# Patient Record
Sex: Female | Born: 1945 | Race: Black or African American | Hispanic: No | Marital: Single | Smoking: Never smoker
Health system: Southern US, Community
[De-identification: ages and names within clinical notes are randomized; demographics above are authoritative.]

## PROBLEM LIST (undated history)

## (undated) DIAGNOSIS — M1990 Unspecified osteoarthritis, unspecified site: Secondary | ICD-10-CM

## (undated) DIAGNOSIS — F0150 Vascular dementia without behavioral disturbance: Secondary | ICD-10-CM

## (undated) DIAGNOSIS — E785 Hyperlipidemia, unspecified: Secondary | ICD-10-CM

## (undated) DIAGNOSIS — R32 Unspecified urinary incontinence: Secondary | ICD-10-CM

## (undated) DIAGNOSIS — E119 Type 2 diabetes mellitus without complications: Secondary | ICD-10-CM

## (undated) DIAGNOSIS — F0390 Unspecified dementia without behavioral disturbance: Secondary | ICD-10-CM

## (undated) DIAGNOSIS — F329 Major depressive disorder, single episode, unspecified: Secondary | ICD-10-CM

## (undated) DIAGNOSIS — I639 Cerebral infarction, unspecified: Secondary | ICD-10-CM

## (undated) DIAGNOSIS — G40909 Epilepsy, unspecified, not intractable, without status epilepticus: Secondary | ICD-10-CM

## (undated) DIAGNOSIS — G8191 Hemiplegia, unspecified affecting right dominant side: Secondary | ICD-10-CM

## (undated) DIAGNOSIS — N189 Chronic kidney disease, unspecified: Secondary | ICD-10-CM

## (undated) DIAGNOSIS — C801 Malignant (primary) neoplasm, unspecified: Secondary | ICD-10-CM

## (undated) DIAGNOSIS — R569 Unspecified convulsions: Secondary | ICD-10-CM

## (undated) DIAGNOSIS — E538 Deficiency of other specified B group vitamins: Secondary | ICD-10-CM

## (undated) DIAGNOSIS — N289 Disorder of kidney and ureter, unspecified: Secondary | ICD-10-CM

## (undated) DIAGNOSIS — I10 Essential (primary) hypertension: Secondary | ICD-10-CM

## (undated) DIAGNOSIS — F32A Depression, unspecified: Secondary | ICD-10-CM

## (undated) DIAGNOSIS — M109 Gout, unspecified: Secondary | ICD-10-CM

## (undated) DIAGNOSIS — K219 Gastro-esophageal reflux disease without esophagitis: Secondary | ICD-10-CM

## (undated) HISTORY — DX: Deficiency of other specified B group vitamins: E53.8

## (undated) HISTORY — DX: Hemiplegia, unspecified affecting right dominant side: G81.91

## (undated) HISTORY — DX: Vascular dementia, unspecified severity, without behavioral disturbance, psychotic disturbance, mood disturbance, and anxiety: F01.50

## (undated) HISTORY — DX: Unspecified urinary incontinence: R32

## (undated) HISTORY — DX: Unspecified osteoarthritis, unspecified site: M19.90

---

## 2015-08-12 ENCOUNTER — Emergency Department (HOSPITAL_COMMUNITY)
Admission: EM | Admit: 2015-08-12 | Discharge: 2015-08-13 | Disposition: A | Discharge status: Home / Self Care | Payer: Medicare Other | Attending: Emergency Medicine | Admitting: Emergency Medicine

## 2015-08-12 ENCOUNTER — Encounter (HOSPITAL_COMMUNITY): Payer: Self-pay | Admitting: Emergency Medicine

## 2015-08-12 DIAGNOSIS — M1990 Unspecified osteoarthritis, unspecified site: Secondary | ICD-10-CM | POA: Insufficient documentation

## 2015-08-12 DIAGNOSIS — F0390 Unspecified dementia without behavioral disturbance: Secondary | ICD-10-CM | POA: Diagnosis not present

## 2015-08-12 DIAGNOSIS — Z8742 Personal history of other diseases of the female genital tract: Secondary | ICD-10-CM | POA: Diagnosis not present

## 2015-08-12 DIAGNOSIS — I10 Essential (primary) hypertension: Secondary | ICD-10-CM | POA: Insufficient documentation

## 2015-08-12 DIAGNOSIS — E785 Hyperlipidemia, unspecified: Secondary | ICD-10-CM | POA: Diagnosis not present

## 2015-08-12 DIAGNOSIS — Z7902 Long term (current) use of antithrombotics/antiplatelets: Secondary | ICD-10-CM | POA: Insufficient documentation

## 2015-08-12 DIAGNOSIS — Z880 Allergy status to penicillin: Secondary | ICD-10-CM | POA: Insufficient documentation

## 2015-08-12 DIAGNOSIS — M109 Gout, unspecified: Secondary | ICD-10-CM | POA: Diagnosis not present

## 2015-08-12 DIAGNOSIS — Z79899 Other long term (current) drug therapy: Secondary | ICD-10-CM | POA: Insufficient documentation

## 2015-08-12 DIAGNOSIS — R4182 Altered mental status, unspecified: Secondary | ICD-10-CM | POA: Diagnosis present

## 2015-08-12 DIAGNOSIS — G8190 Hemiplegia, unspecified affecting unspecified side: Secondary | ICD-10-CM

## 2015-08-12 DIAGNOSIS — I69351 Hemiplegia and hemiparesis following cerebral infarction affecting right dominant side: Secondary | ICD-10-CM | POA: Insufficient documentation

## 2015-08-12 DIAGNOSIS — K219 Gastro-esophageal reflux disease without esophagitis: Secondary | ICD-10-CM | POA: Insufficient documentation

## 2015-08-12 DIAGNOSIS — Z859 Personal history of malignant neoplasm, unspecified: Secondary | ICD-10-CM | POA: Diagnosis not present

## 2015-08-12 DIAGNOSIS — R4701 Aphasia: Secondary | ICD-10-CM

## 2015-08-12 DIAGNOSIS — F329 Major depressive disorder, single episode, unspecified: Secondary | ICD-10-CM | POA: Insufficient documentation

## 2015-08-12 DIAGNOSIS — G40909 Epilepsy, unspecified, not intractable, without status epilepticus: Secondary | ICD-10-CM | POA: Insufficient documentation

## 2015-08-12 HISTORY — DX: Unspecified osteoarthritis, unspecified site: M19.90

## 2015-08-12 HISTORY — DX: Unspecified dementia, unspecified severity, without behavioral disturbance, psychotic disturbance, mood disturbance, and anxiety: F03.90

## 2015-08-12 HISTORY — DX: Gout, unspecified: M10.9

## 2015-08-12 HISTORY — DX: Malignant (primary) neoplasm, unspecified: C80.1

## 2015-08-12 HISTORY — DX: Depression, unspecified: F32.A

## 2015-08-12 HISTORY — DX: Gastro-esophageal reflux disease without esophagitis: K21.9

## 2015-08-12 HISTORY — DX: Unspecified convulsions: R56.9

## 2015-08-12 HISTORY — DX: Hyperlipidemia, unspecified: E78.5

## 2015-08-12 HISTORY — DX: Major depressive disorder, single episode, unspecified: F32.9

## 2015-08-12 HISTORY — DX: Cerebral infarction, unspecified: I63.9

## 2015-08-12 HISTORY — DX: Disorder of kidney and ureter, unspecified: N28.9

## 2015-08-12 HISTORY — DX: Essential (primary) hypertension: I10

## 2015-08-12 LAB — PROTIME-INR
INR: 1.2 (ref 0.00–1.49)
Prothrombin Time: 15.4 seconds — ABNORMAL HIGH (ref 11.6–15.2)

## 2015-08-12 LAB — COMPREHENSIVE METABOLIC PANEL
ALT: 72 U/L — ABNORMAL HIGH (ref 14–54)
AST: 64 U/L — ABNORMAL HIGH (ref 15–41)
Albumin: 3.6 g/dL (ref 3.5–5.0)
Alkaline Phosphatase: 91 U/L (ref 38–126)
Anion gap: 10 (ref 5–15)
BUN: 45 mg/dL — ABNORMAL HIGH (ref 6–20)
CHLORIDE: 108 mmol/L (ref 101–111)
CO2: 23 mmol/L (ref 22–32)
Calcium: 9.7 mg/dL (ref 8.9–10.3)
Creatinine, Ser: 2.08 mg/dL — ABNORMAL HIGH (ref 0.44–1.00)
GFR, EST AFRICAN AMERICAN: 27 mL/min — AB (ref >60)
GFR, EST NON AFRICAN AMERICAN: 23 mL/min — AB (ref >60)
Glucose, Bld: 147 mg/dL — ABNORMAL HIGH (ref 65–99)
POTASSIUM: 5.1 mmol/L (ref 3.5–5.1)
Sodium: 141 mmol/L (ref 135–145)
Total Bilirubin: 0.7 mg/dL (ref 0.3–1.2)
Total Protein: 8.4 g/dL — ABNORMAL HIGH (ref 6.5–8.1)

## 2015-08-12 LAB — CBC
HEMATOCRIT: 43 % (ref 36.0–46.0)
Hemoglobin: 13.7 g/dL (ref 12.0–15.0)
MCH: 30.9 pg (ref 26.0–34.0)
MCHC: 31.9 g/dL (ref 30.0–36.0)
MCV: 96.8 fL (ref 78.0–100.0)
Platelets: 145 10*3/uL — ABNORMAL LOW (ref 150–400)
RBC: 4.44 MIL/uL (ref 3.87–5.11)
RDW: 13.3 % (ref 11.5–15.5)
WBC: 9.4 10*3/uL (ref 4.0–10.5)

## 2015-08-12 NOTE — ED Notes (Signed)
Pt here via EMS from [place]. 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 Pt daughter sts deficits are getting progressively worse. Staff at facility noticed acute onset intermittent tremors in all extremities. EMS only noted tremor to LUE once. Pt has been non verbal since the CVA, Pt looking around in triage.

## 2015-08-12 NOTE — ED Notes (Signed)
Pt non verbal with right sided deficits from previous stroke. Seen at different hospital then. Pt came from [facility] today for possible altered mental status. Pt responds to painful stimuli and can nod yes and no when she chooses. Pt does not follow commands and does not answer all questions. Pt able to feel sensation to all extremities. Pt was able to nod  "yes" when asked if she wanted the light off and also nodded "no" when asked if she wanted the tv on.

## 2015-08-12 NOTE — ED Notes (Signed)
Pt here from [place].

## 2015-08-13 ENCOUNTER — Emergency Department (HOSPITAL_COMMUNITY): Payer: Medicare Other

## 2015-08-13 ENCOUNTER — Encounter (HOSPITAL_COMMUNITY): Payer: Self-pay

## 2015-08-13 DIAGNOSIS — I69351 Hemiplegia and hemiparesis following cerebral infarction affecting right dominant side: Secondary | ICD-10-CM | POA: Diagnosis not present

## 2015-08-13 LAB — URINALYSIS, ROUTINE W REFLEX MICROSCOPIC
GLUCOSE, UA: NEGATIVE mg/dL
Hgb urine dipstick: NEGATIVE
KETONES UR: 15 mg/dL — AB
NITRITE: NEGATIVE
PH: 5 (ref 5.0–8.0)
Protein, ur: NEGATIVE mg/dL
SPECIFIC GRAVITY, URINE: 1.021 (ref 1.005–1.030)
Urobilinogen, UA: 1 mg/dL (ref 0.0–1.0)

## 2015-08-13 LAB — URINE MICROSCOPIC-ADD ON

## 2015-08-13 MED ORDER — LEVOFLOXACIN 750 MG PO TABS: 750.0000 mg | ORAL_TABLET | Freq: Every day | ORAL | Status: DC

## 2015-08-13 MED ORDER — LEVOFLOXACIN 750 MG PO TABS
750.0000 mg | ORAL_TABLET | Freq: Once | ORAL | Status: AC
  Administered 2015-08-13: 750 mg via ORAL
  Filled 2015-08-13: qty 1

## 2015-08-13 MED ORDER — SODIUM CHLORIDE 0.9 % IV BOLUS (SEPSIS)
1000.0000 mL | Freq: Once | INTRAVENOUS | Status: AC
  Administered 2015-08-13: 1000 mL via INTRAVENOUS

## 2015-08-13 NOTE — ED Provider Notes (Signed)
CSN: [id number]     Arrival date & time 08/12/15  2020 History   First MD Initiated Contact with Patient 08/12/15 2049     Chief Complaint  Patient presents with  . 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  The staff and family were concerned because she seemed to have more progression of deficits on the right.  The staff states they noticed a tremor in all of her extremities.  The patient is unable to give me any history due to the fact that she is aphasic.   Past Medical History  Diagnosis Date  . Arthritis   . Stroke   . Renal disorder   . Seizures   . GERD (gastroesophageal reflux disease)   . Depression   . Gout   . Hypertension   . Hyperlipidemia   . Cancer   . Dementia    History reviewed. No pertinent past surgical history. History reviewed. No pertinent family history. Social History  Substance Use Topics  . Smoking status: Unknown If Ever Smoked  . Smokeless tobacco: None  . Alcohol Use: None   OB History    No data available     Review of Systems  Level V caveat applies due to aphasia  Allergies  Contrast media; Iodine; Keflex; Macrodantin; and Penicillins  Home Medications   Prior to Admission medications   Medication Sig Start Date End Date Taking? Authorizing Provider  atorvastatin (LIPITOR) 80 MG tablet Take 80 mg by mouth daily at 6 PM.   Yes Historical Provider, MD  Cholecalciferol 1000 UNITS capsule Take 2,000 Units by mouth daily.   Yes Historical Provider, MD  clopidogrel (PLAVIX) 75 MG tablet Take 75 mg by mouth daily.   Yes Historical Provider, MD  colchicine 0.6 MG tablet Take 0.6 mg by mouth 2 (two) times daily.   Yes Historical Provider, MD  divalproex (DEPAKOTE ER) 500 MG 24 hr tablet Take 500 mg by  mouth 2 (two) times daily.   Yes Historical Provider, MD  donepezil (ARICEPT) 10 MG tablet Take 10 mg by mouth at bedtime.   Yes Historical Provider, MD  enalapril (VASOTEC) 20 MG tablet Take 20 mg by mouth daily.   Yes Historical Provider, MD  FLUoxetine (PROZAC) 10 MG tablet Take 10 mg by mouth daily.   Yes Historical Provider, MD  furosemide (LASIX) 20 MG tablet Take 20 mg by mouth daily.   Yes Historical Provider, MD  omega-3 acid ethyl esters (LOVAZA) 1 G capsule Take 1 g by mouth 2 (two) times daily.   Yes Historical Provider, MD  omeprazole (PRILOSEC) 20 MG capsule Take 20 mg by mouth daily.   Yes Historical Provider, MD  promethazine (PHENERGAN) 12.5 MG tablet Take 12.5 mg by mouth every 8 (eight) hours as needed for nausea or vomiting.   Yes Historical Provider, MD  Vitamin D, Ergocalciferol, (DRISDOL) 50000 UNITS CAPS capsule Take 50,000 Units by mouth every 7 (seven) days.   Yes Historical Provider, MD   BP 113/76 mmHg  Pulse 61  Temp(Src) 98.2 F (36.8 C)  Resp 16  SpO2 98% Physical Exam  Constitutional: She appears well-developed and well-nourished. No distress.  HENT:  Head: Normocephalic and atraumatic.  Mouth/Throat: Oropharynx is clear and moist.  Eyes: Pupils are equal, round, and reactive to light.  Neck: Normal range  of motion. Neck supple.  Cardiovascular: Normal rate, regular rhythm and normal heart sounds.  Exam reveals no gallop and no friction rub.   No murmur heard. Pulmonary/Chest: Effort normal and breath sounds normal. No respiratory distress.  Neurological: She is alert.  Patient has had a previous stroke in the last couple weeks and has a right hemiplegia but does follow commands and squeeze my fingers on the left and will press against my hand with the the left foot.  There is no significant tremor.  Patient is also a phasic  Skin: Skin is warm and dry. No rash noted. No erythema.  Psychiatric: She has a normal mood and affect. Her behavior is normal.   Nursing note and vitals reviewed.   ED Course  Procedures (including critical care time) Labs Review Labs Reviewed  COMPREHENSIVE METABOLIC PANEL - Abnormal; Notable for the following:    Glucose, Bld 147 (*)    BUN 45 (*)    Creatinine, Ser 2.08 (*)    Total Protein 8.4 (*)    AST 64 (*)    ALT 72 (*)    GFR calc non Af Amer 23 (*)    GFR calc Af Amer 27 (*)    All other components within normal limits  CBC - Abnormal; Notable for the following:    Platelets 145 (*)    All other components within normal limits  PROTIME-INR - Abnormal; Notable for the following:    Prothrombin Time 15.4 (*)    All other components within normal limits  CBG MONITORING, ED    Imaging Review Dg Chest 2 View  08/13/2015   CLINICAL DATA:  69 year old female with lower and recent stroke  EXAM: CHEST  2 VIEW  COMPARISON:  None.  FINDINGS: The heart size and mediastinal contours are within normal limits. Both lungs are clear. The visualized skeletal structures are unremarkable.  IMPRESSION: No active cardiopulmonary disease.   Electronically Signed   By: Anner Crete M.D.   On: 08/13/2015 01:03   I have personally reviewed and evaluated these images and lab results as part of my medical decision-making.   The patient will follow my commands and does not appear to be tremoring on my examination, the patient appears to have standard hemiplegia and aphasia   Dalia Heading, PA-C 08/13/15 0125  Everlene Balls, MD 08/13/15 [phone]

## 2015-08-13 NOTE — Discharge Instructions (Signed)
Return here as needed.  Follow-up with your primary care doctorUrinary Tract Infection A urinary tract infection (UTI) can occur any place along the urinary tract. The tract includes the kidneys, ureters, bladder, and urethra. A type of germ called bacteria often causes a UTI. UTIs are often helped with antibiotic medicine.  HOME CARE   If given, take antibiotics as told by your doctor. Finish them even if you start to feel better.  Drink enough fluids to keep your pee (urine) clear or pale yellow.  Avoid tea, drinks with caffeine, and bubbly (carbonated) drinks.  Pee often. Avoid holding your pee in for a long time.  Pee before and after having sex (intercourse).  Wipe from front to back after you poop (bowel movement) if you are a woman. Use each tissue only once. GET HELP RIGHT AWAY IF:   You have back pain.  You have lower belly (abdominal) pain.  You have chills.  You feel sick to your stomach (nauseous).  You throw up (vomit).  Your burning or discomfort with peeing does not go away.  You have a fever.  Your symptoms are not better in 3 days. MAKE SURE YOU:   Understand these instructions.  Will watch your condition.  Will get help right away if you are not doing well or get worse. Document Released: 05/04/2008 Document Revised: 08/10/2012 Document Reviewed: 06/16/2012 [facility] Patient Information 2015 [place], [place]. This information is not intended to replace advice given to you by your health care provider. Make sure you discuss any questions you have with your health care provider.

## 2016-08-15 IMAGING — CR DG CHEST 2V
2 series · 2 of 2 positions shown · non-contrast
Comparison: None.

CLINICAL DATA: 69-year-old female with lower and recent stroke

EXAM:
CHEST  2 VIEW

[chest lat]
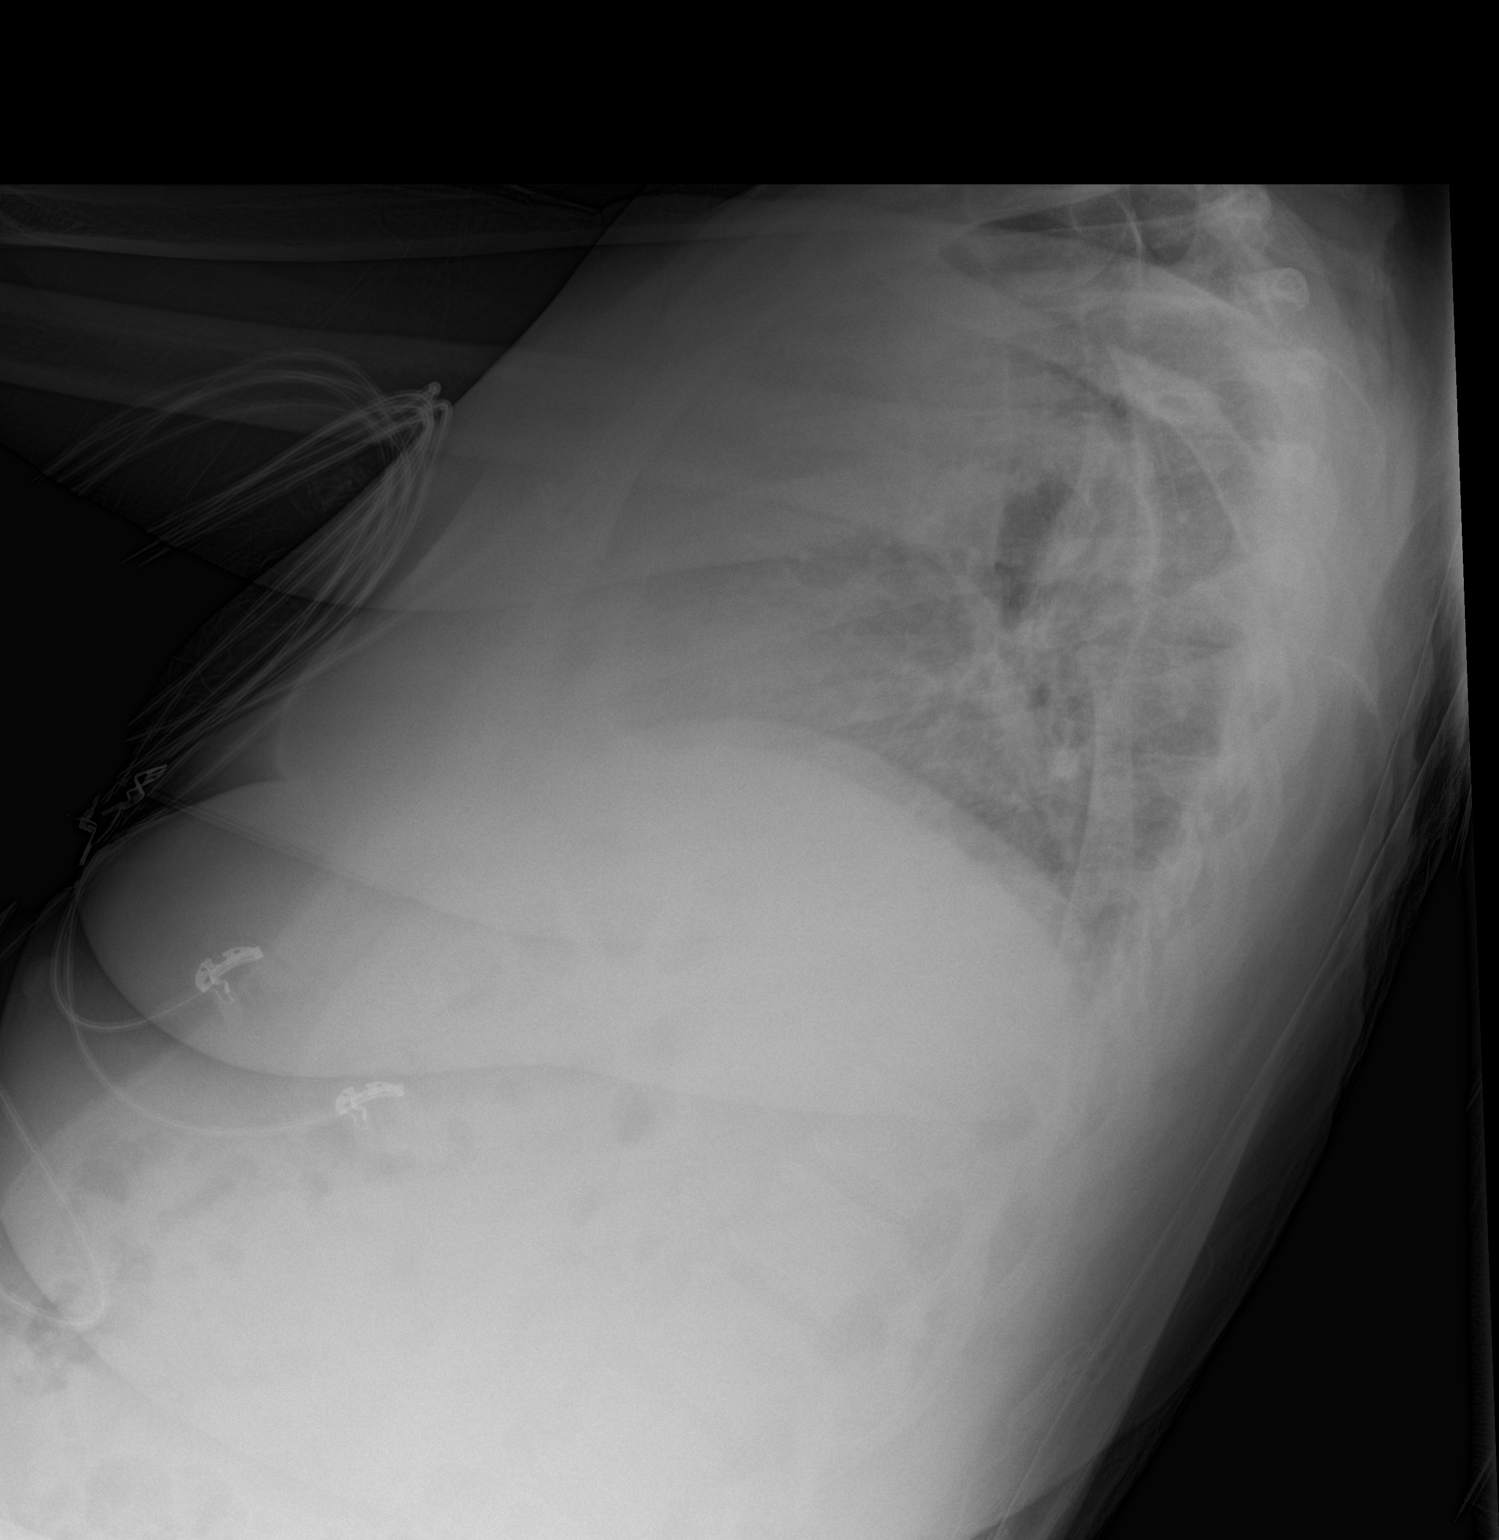

[chest ap]
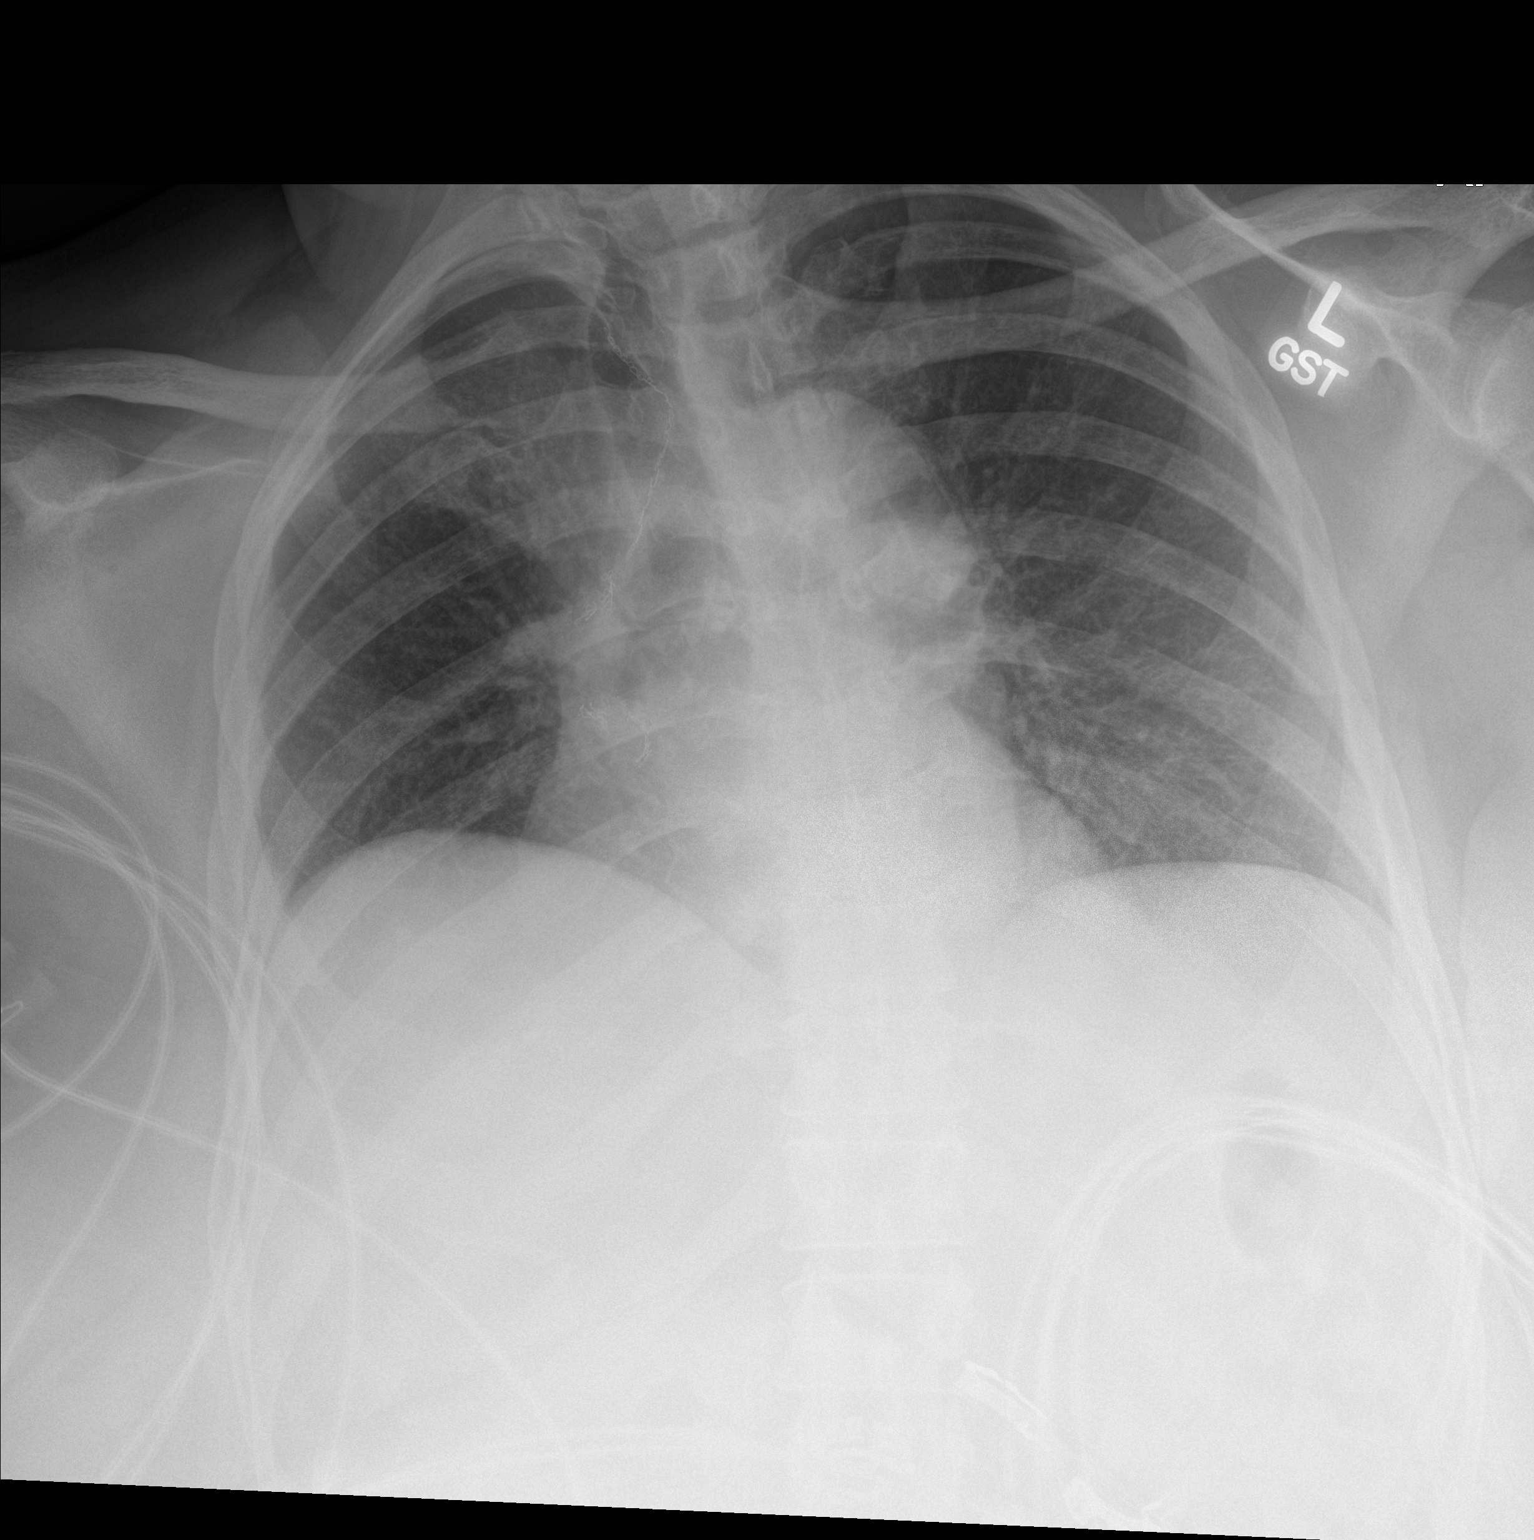

[2 of 2 positions shown; findings below may reference images not displayed]

FINDINGS: The heart size and mediastinal contours are within normal limits.
Both lungs are clear. The visualized skeletal structures are
unremarkable.
IMPRESSION: No active cardiopulmonary disease.

## 2016-08-15 IMAGING — CT CT HEAD W/O CM
2 series · 15 of 30 positions shown, 19 images · non-contrast
Comparison: None.  No prior exams available for comparison.

CLINICAL DATA: Tremor. Recent CVA 2 weeks prior with right-sided
deficit. Progressive deficits.

EXAM:
CT HEAD WITHOUT CONTRAST
TECHNIQUE: Contiguous axial images were obtained from the base of the skull
through the vertex without intravenous contrast.

[Series 2: head 5.0 h30s · axial · 0.43mm/px · z∈[-151,-26]mm · 13 of 31 slices shown, 17 images]
[im 3/31  brain]
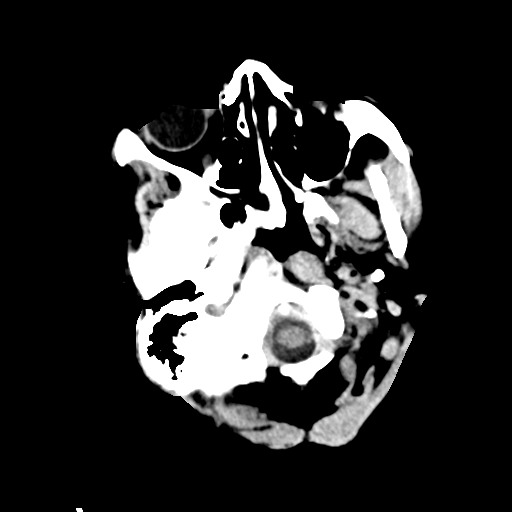
[im 3/31  bone]
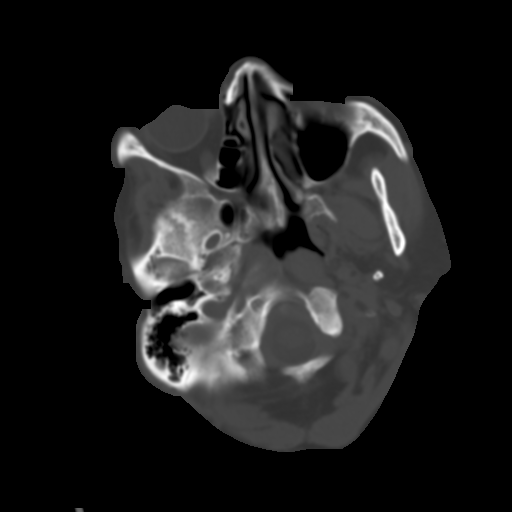
[im 5/31  brain]
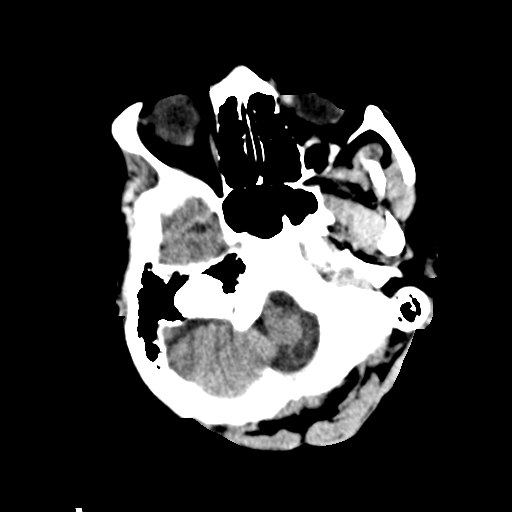
[im 7/31  brain]
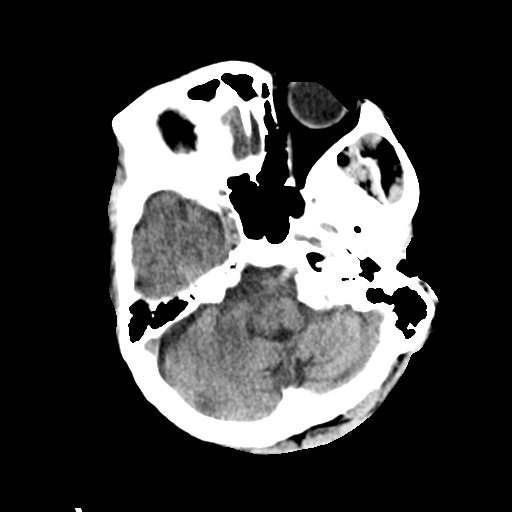
[im 9/31  brain]
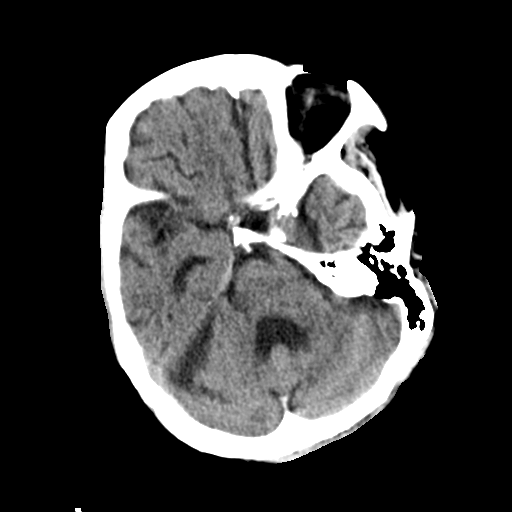
[im 11/31  brain]
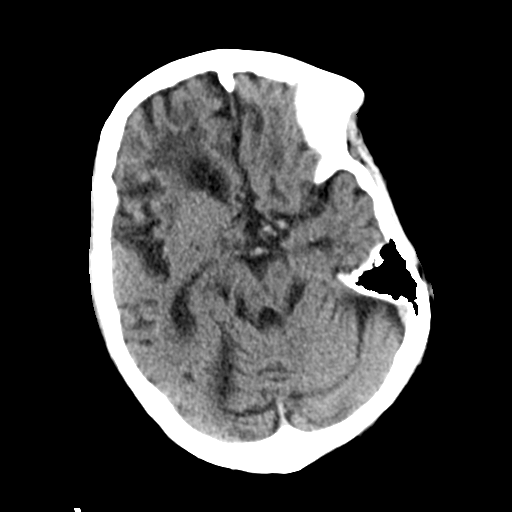
[im 11/31  bone]
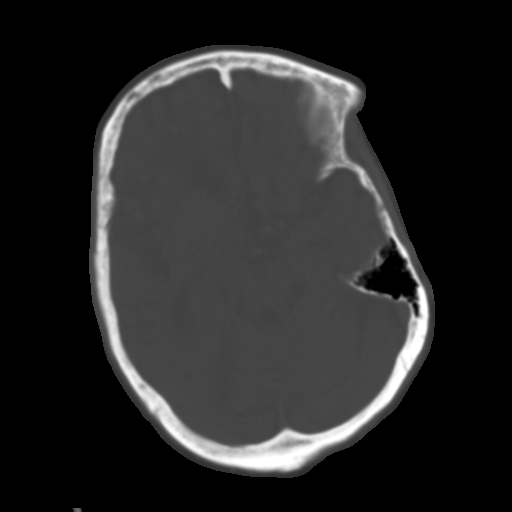
[im 13/31  brain]
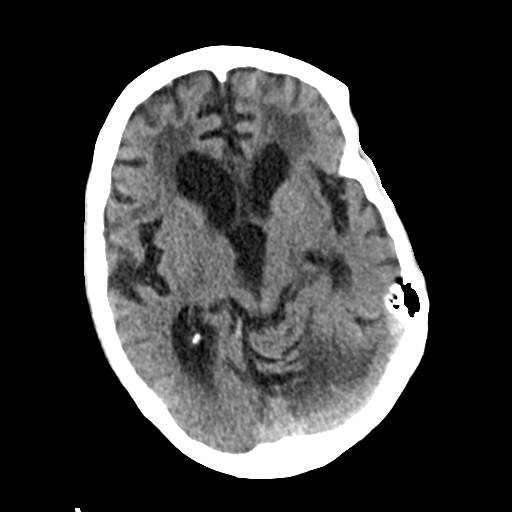
[im 16/31  brain]
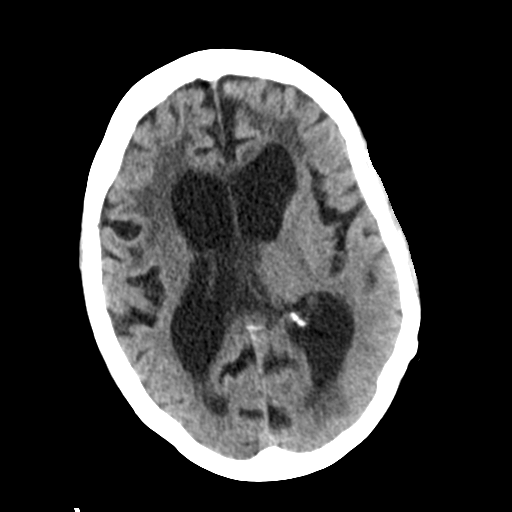
[im 18/31  brain]
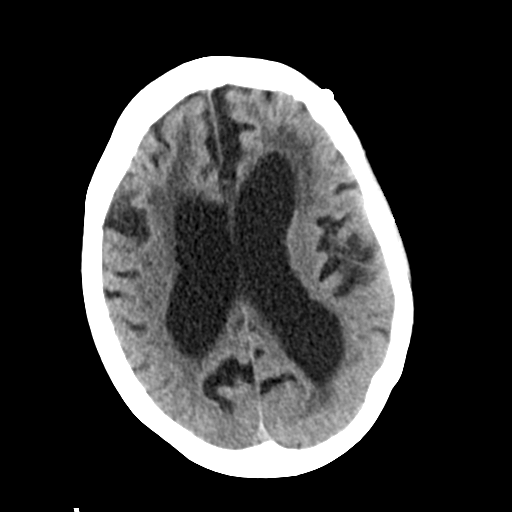
[im 20/31  brain]
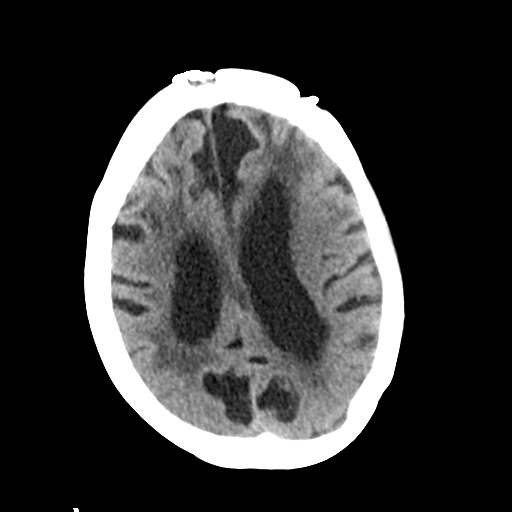
[im 20/31  bone]
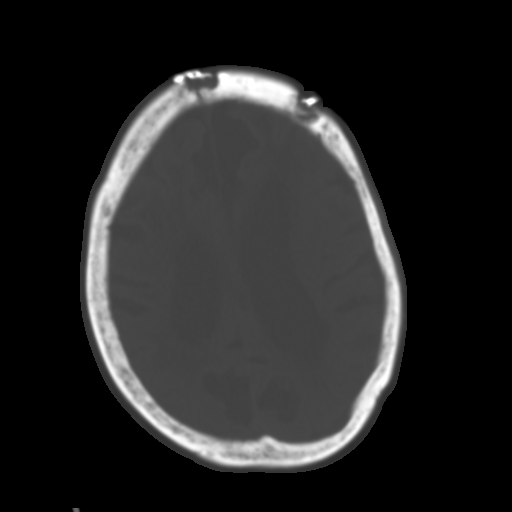
[im 22/31  brain]
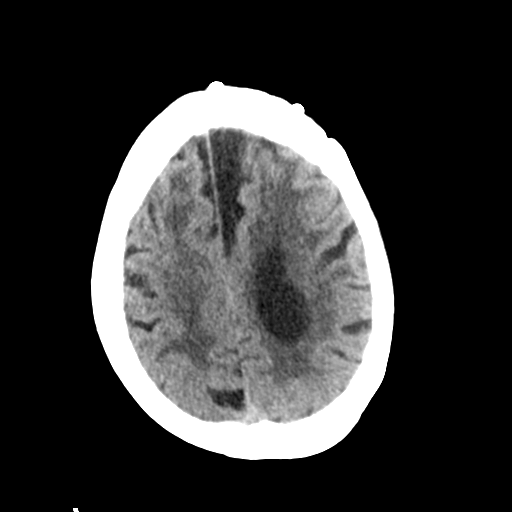
[im 24/31  brain]
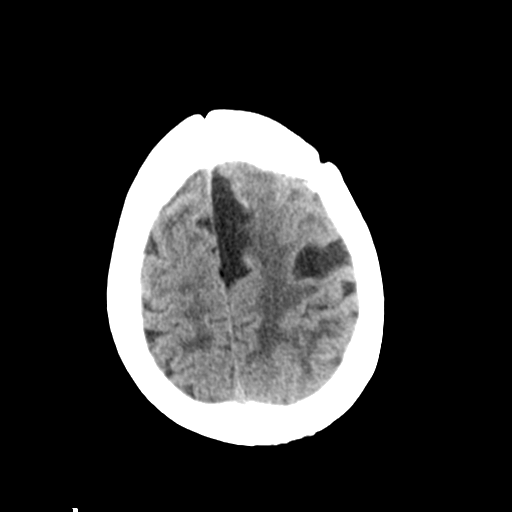
[im 26/31  brain]
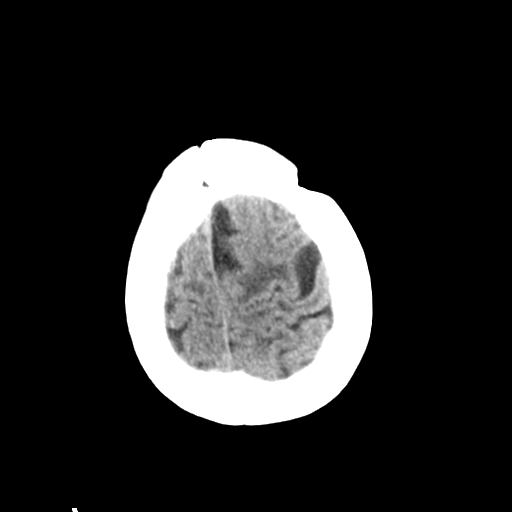
[im 28/31  brain]
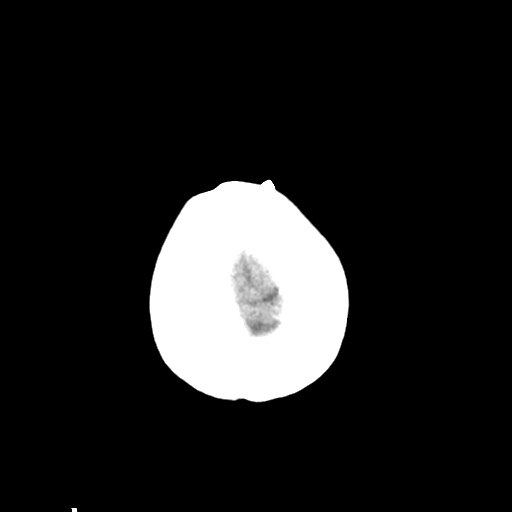
[im 28/31  bone]
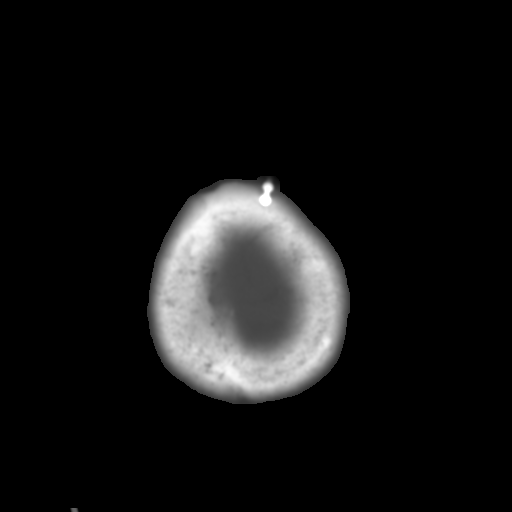

[Series 602: <mpr range> · axial · 0.44mm/px · z∈[-176,-156]mm · 2 of 30 slices shown]
[im 3/30  brain]
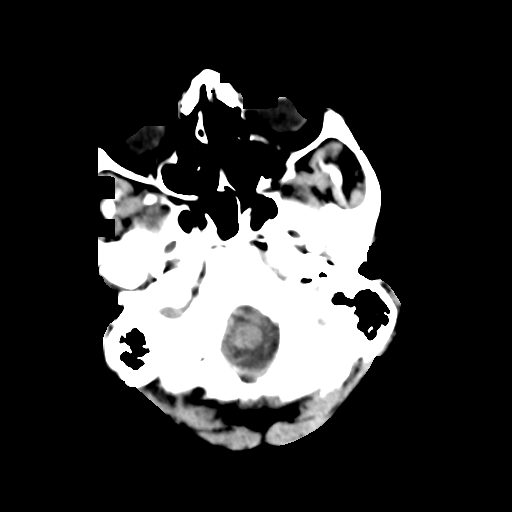
[im 7/30  brain]
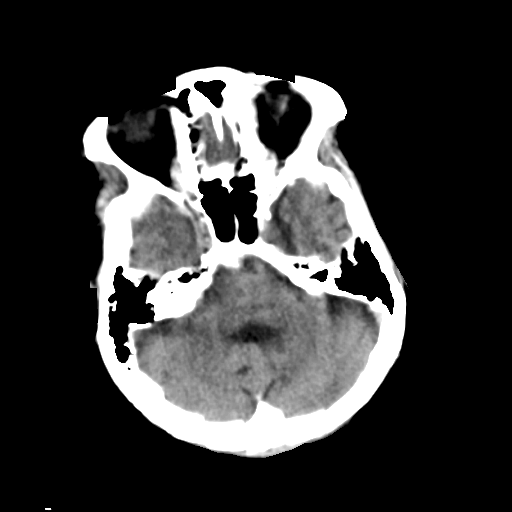

[15 of 30 positions shown; findings below may reference images not displayed]

FINDINGS: There is ventriculomegaly, likely related atrophy. Prominence of the
extra-axial CSF spaces. There is periventricular and deep white
matter hypodensity, most consistent chronic small vessel ischemia.
Probable bifrontal encephalomalacia. No acute hemorrhage. No mass
effect or midline shift. No subdural fluid collection. Post
craniotomy change in the midline and left frontal bone. Diffusely
heterogeneous appearance of the calvarium, nonspecific. Included
paranasal sinuses and mastoid air cells are well aerated.
IMPRESSION: 1. No acute intracranial abnormality.
2. Advanced atrophy and chronic small vessel ischemia. Bifrontal
encephalomalacia.
3. Post craniotomy. Diffusely heterogeneous appearance of the
calvarium is nonspecific, can be seen in the setting of advanced
osteoporosis, sequela of anemia or chronic disease, or other
metabolic abnormalities. Metastatic disease not entirely excluded
but felt less likely given the diffuse nature. Recommend correlation
with any prior imaging.

## 2018-12-06 ENCOUNTER — Emergency Department (HOSPITAL_COMMUNITY): Payer: Medicare Other

## 2018-12-06 ENCOUNTER — Encounter (HOSPITAL_COMMUNITY): Payer: Self-pay | Admitting: Emergency Medicine

## 2018-12-06 ENCOUNTER — Observation Stay (HOSPITAL_COMMUNITY)
Admission: EM | Admit: 2018-12-06 | Discharge: 2018-12-09 | Disposition: A | Discharge status: Skilled Nursing Facility | Payer: Medicare Other | Attending: Internal Medicine | Admitting: Internal Medicine

## 2018-12-06 DIAGNOSIS — I69391 Dysphagia following cerebral infarction: Secondary | ICD-10-CM | POA: Diagnosis not present

## 2018-12-06 DIAGNOSIS — K117 Disturbances of salivary secretion: Secondary | ICD-10-CM

## 2018-12-06 DIAGNOSIS — F329 Major depressive disorder, single episode, unspecified: Secondary | ICD-10-CM | POA: Insufficient documentation

## 2018-12-06 DIAGNOSIS — G9341 Metabolic encephalopathy: Principal | ICD-10-CM | POA: Insufficient documentation

## 2018-12-06 DIAGNOSIS — R059 Cough, unspecified: Secondary | ICD-10-CM

## 2018-12-06 DIAGNOSIS — R05 Cough: Secondary | ICD-10-CM | POA: Diagnosis present

## 2018-12-06 DIAGNOSIS — Z993 Dependence on wheelchair: Secondary | ICD-10-CM | POA: Diagnosis not present

## 2018-12-06 DIAGNOSIS — R159 Full incontinence of feces: Secondary | ICD-10-CM | POA: Insufficient documentation

## 2018-12-06 DIAGNOSIS — R4701 Aphasia: Secondary | ICD-10-CM

## 2018-12-06 DIAGNOSIS — Z79899 Other long term (current) drug therapy: Secondary | ICD-10-CM | POA: Insufficient documentation

## 2018-12-06 DIAGNOSIS — I10 Essential (primary) hypertension: Secondary | ICD-10-CM | POA: Diagnosis present

## 2018-12-06 DIAGNOSIS — R509 Fever, unspecified: Secondary | ICD-10-CM | POA: Diagnosis present

## 2018-12-06 DIAGNOSIS — F0150 Vascular dementia without behavioral disturbance: Secondary | ICD-10-CM | POA: Diagnosis not present

## 2018-12-06 DIAGNOSIS — E1122 Type 2 diabetes mellitus with diabetic chronic kidney disease: Secondary | ICD-10-CM | POA: Diagnosis not present

## 2018-12-06 DIAGNOSIS — I129 Hypertensive chronic kidney disease with stage 1 through stage 4 chronic kidney disease, or unspecified chronic kidney disease: Secondary | ICD-10-CM | POA: Insufficient documentation

## 2018-12-06 DIAGNOSIS — D72829 Elevated white blood cell count, unspecified: Secondary | ICD-10-CM | POA: Diagnosis not present

## 2018-12-06 DIAGNOSIS — F32A Depression, unspecified: Secondary | ICD-10-CM | POA: Diagnosis present

## 2018-12-06 DIAGNOSIS — R5381 Other malaise: Secondary | ICD-10-CM | POA: Diagnosis not present

## 2018-12-06 DIAGNOSIS — R4182 Altered mental status, unspecified: Secondary | ICD-10-CM | POA: Diagnosis present

## 2018-12-06 DIAGNOSIS — N189 Chronic kidney disease, unspecified: Secondary | ICD-10-CM | POA: Diagnosis not present

## 2018-12-06 DIAGNOSIS — I69351 Hemiplegia and hemiparesis following cerebral infarction affecting right dominant side: Secondary | ICD-10-CM | POA: Diagnosis not present

## 2018-12-06 DIAGNOSIS — G8191 Hemiplegia, unspecified affecting right dominant side: Secondary | ICD-10-CM

## 2018-12-06 DIAGNOSIS — E785 Hyperlipidemia, unspecified: Secondary | ICD-10-CM | POA: Diagnosis not present

## 2018-12-06 DIAGNOSIS — G9340 Encephalopathy, unspecified: Secondary | ICD-10-CM | POA: Diagnosis present

## 2018-12-06 DIAGNOSIS — C3411 Malignant neoplasm of upper lobe, right bronchus or lung: Secondary | ICD-10-CM

## 2018-12-06 HISTORY — DX: Type 2 diabetes mellitus without complications: E11.9

## 2018-12-06 HISTORY — DX: Chronic kidney disease, unspecified: N18.9

## 2018-12-06 HISTORY — DX: Unspecified urinary incontinence: R32

## 2018-12-06 HISTORY — DX: Epilepsy, unspecified, not intractable, without status epilepticus: G40.909

## 2018-12-06 LAB — CBC WITH DIFFERENTIAL/PLATELET
Abs Immature Granulocytes: 0.08 10*3/uL — ABNORMAL HIGH (ref 0.00–0.07)
Basophils Absolute: 0 10*3/uL (ref 0.0–0.1)
Basophils Relative: 0 %
EOS ABS: 0.1 10*3/uL (ref 0.0–0.5)
Eosinophils Relative: 1 %
HCT: 46.4 % — ABNORMAL HIGH (ref 36.0–46.0)
Hemoglobin: 14.6 g/dL (ref 12.0–15.0)
Immature Granulocytes: 1 %
Lymphocytes Relative: 15 %
Lymphs Abs: 2 10*3/uL (ref 0.7–4.0)
MCH: 31.1 pg (ref 26.0–34.0)
MCHC: 31.5 g/dL (ref 30.0–36.0)
MCV: 98.7 fL (ref 80.0–100.0)
Monocytes Absolute: 0.8 10*3/uL (ref 0.1–1.0)
Monocytes Relative: 6 %
Neutro Abs: 9.9 10*3/uL — ABNORMAL HIGH (ref 1.7–7.7)
Neutrophils Relative %: 77 %
Platelets: 190 10*3/uL (ref 150–400)
RBC: 4.7 MIL/uL (ref 3.87–5.11)
RDW: 14.2 % (ref 11.5–15.5)
WBC: 12.8 10*3/uL — AB (ref 4.0–10.5)
nRBC: 0 % (ref 0.0–0.2)

## 2018-12-06 LAB — COMPREHENSIVE METABOLIC PANEL
ALT: 29 U/L (ref 0–44)
ANION GAP: 11 (ref 5–15)
AST: 27 U/L (ref 15–41)
Albumin: 4 g/dL (ref 3.5–5.0)
Alkaline Phosphatase: 78 U/L (ref 38–126)
BUN: 15 mg/dL (ref 8–23)
CO2: 24 mmol/L (ref 22–32)
Calcium: 10.1 mg/dL (ref 8.9–10.3)
Chloride: 103 mmol/L (ref 98–111)
Creatinine, Ser: 1.35 mg/dL — ABNORMAL HIGH (ref 0.44–1.00)
GFR calc Af Amer: 46 mL/min — ABNORMAL LOW (ref >60)
GFR calc non Af Amer: 39 mL/min — ABNORMAL LOW (ref >60)
Glucose, Bld: 153 mg/dL — ABNORMAL HIGH (ref 70–99)
POTASSIUM: 4.5 mmol/L (ref 3.5–5.1)
Sodium: 138 mmol/L (ref 135–145)
Total Bilirubin: 0.9 mg/dL (ref 0.3–1.2)
Total Protein: 9 g/dL — ABNORMAL HIGH (ref 6.5–8.1)

## 2018-12-06 LAB — URINALYSIS, ROUTINE W REFLEX MICROSCOPIC
BILIRUBIN URINE: NEGATIVE
Glucose, UA: NEGATIVE mg/dL
HGB URINE DIPSTICK: NEGATIVE
Ketones, ur: NEGATIVE mg/dL
Leukocytes, UA: NEGATIVE
Nitrite: NEGATIVE
Protein, ur: NEGATIVE mg/dL
Specific Gravity, Urine: 1.018 (ref 1.005–1.030)
pH: 5 (ref 5.0–8.0)

## 2018-12-06 LAB — CBG MONITORING, ED: Glucose-Capillary: 135 mg/dL — ABNORMAL HIGH (ref 70–99)

## 2018-12-06 LAB — RAPID URINE DRUG SCREEN, HOSP PERFORMED
Amphetamines: NOT DETECTED
Barbiturates: NOT DETECTED
Benzodiazepines: NOT DETECTED
Cocaine: NOT DETECTED
Opiates: NOT DETECTED
Tetrahydrocannabinol: NOT DETECTED

## 2018-12-06 LAB — LIPASE, BLOOD: LIPASE: 42 U/L (ref 11–51)

## 2018-12-06 LAB — I-STAT TROPONIN, ED: Troponin i, poc: 0 ng/mL (ref 0.00–0.08)

## 2018-12-06 LAB — TSH: TSH: 1.421 u[IU]/mL (ref 0.350–4.500)

## 2018-12-06 LAB — AMMONIA: AMMONIA: 17 umol/L (ref 9–35)

## 2018-12-06 MED ORDER — SODIUM CHLORIDE 0.9 % IV BOLUS
500.0000 mL | Freq: Once | INTRAVENOUS | Status: AC
  Administered 2018-12-06: 500 mL via INTRAVENOUS

## 2018-12-06 NOTE — ED Notes (Signed)
All questions were based off answers from the facility, [facility], including suicidal risk questions.

## 2018-12-06 NOTE — ED Notes (Signed)
Bed: AK35 Expected date:  Expected time:  Means of arrival:  Comments: EMS-SI

## 2018-12-06 NOTE — ED Triage Notes (Signed)
Patient here via EMS from [facility] . Staff reports patient stated 5 days ago that she was suicidal. When talking to staff, they couldn't give real information on the patient. Patient not responding to questions asked about suicidal ideation.

## 2018-12-06 NOTE — ED Provider Notes (Signed)
[facility] DEPT Provider Note   CSN: [id number] Arrival date & time: 12/06/18  1639     History   Chief Complaint Chief Complaint  Patient presents with  . Suicidal    HPI Mariah Mann is a 73 y.o. female.  The history is provided by the patient and medical records. No language interpreter was used.  Neurologic Problem  This is a new (Expressive aphasia for the last 2 days.) problem. The current episode started 2 days ago. The problem occurs constantly. The problem has not changed since onset.Pertinent negatives include no chest pain, no abdominal pain, no headaches and no shortness of breath. Nothing aggravates the symptoms. Nothing relieves the symptoms. She has tried nothing for the symptoms. The treatment provided no relief.    Past Medical History:  Diagnosis Date  . Arthritis   . Cancer ([place])   . Dementia ([place])   . Depression   . GERD (gastroesophageal reflux disease)   . Gout   . Hyperlipidemia   . Hypertension   . Renal disorder   . Seizures ([place])   . Stroke [facility])     There are no active problems to display for this patient.   History reviewed. No pertinent surgical history.   OB History   No obstetric history on file.      Home Medications    Prior to Admission medications   Medication Sig Start Date End Date Taking? Authorizing Provider  atorvastatin (LIPITOR) 80 MG tablet Take 80 mg by mouth daily at 6 PM.    [provider]  Cholecalciferol 1000 UNITS capsule Take 2,000 Units by mouth daily.    [provider]  clopidogrel (PLAVIX) 75 MG tablet Take 75 mg by mouth daily.    [provider]  colchicine 0.6 MG tablet Take 0.6 mg by mouth 2 (two) times daily.    [provider]  divalproex (DEPAKOTE ER) 500 MG 24 hr tablet Take 500 mg by mouth 2 (two) times daily.    [provider]  donepezil (ARICEPT) 10 MG tablet Take 10 mg by mouth at bedtime.    [provider]   enalapril (VASOTEC) 20 MG tablet Take 20 mg by mouth daily.    [provider]  FLUoxetine (PROZAC) 10 MG tablet Take 10 mg by mouth daily.    [provider]  furosemide (LASIX) 20 MG tablet Take 20 mg by mouth daily.    [provider]  levofloxacin (LEVAQUIN) 750 MG tablet Take 1 tablet (750 mg total) by mouth daily. 08/13/15   Everlene Balls, MD  omega-3 acid ethyl esters (LOVAZA) 1 G capsule Take 1 g by mouth 2 (two) times daily.    [provider]  omeprazole (PRILOSEC) 20 MG capsule Take 20 mg by mouth daily.    [provider]  promethazine (PHENERGAN) 12.5 MG tablet Take 12.5 mg by mouth every 8 (eight) hours as needed for nausea or vomiting.    [provider]  Vitamin D, Ergocalciferol, (DRISDOL) 50000 UNITS CAPS capsule Take 50,000 Units by mouth every 7 (seven) days.    [provider]    Family History No family history on file.  Social History Social History   Tobacco Use  . Smoking status: Unknown If Ever Smoked  . Smokeless tobacco: Never Used  Substance Use Topics  . Alcohol use: Not on file  . Drug use: Not on file     Allergies   Contrast media [iodinated diagnostic  agents]; Iodine; Keflex [cephalexin]; Macrodantin [nitrofurantoin]; and Penicillins   Review of Systems Review of Systems  Unable to perform ROS: Patient nonverbal (Answers provided by family as patient has expressive aphasia currently.)  Constitutional: Negative for chills, diaphoresis, fatigue and fever.  HENT: Negative for congestion.        Spitting up secretions, daughter reports this is new.  Respiratory: Negative for cough, chest tightness, shortness of breath and wheezing.   Cardiovascular: Negative for chest pain.  Gastrointestinal: Negative for abdominal pain, constipation, diarrhea, nausea and vomiting.  Genitourinary: Negative for dysuria and flank pain.  Musculoskeletal: Negative for back pain, neck pain and neck  stiffness.  Skin: Negative for wound.  Neurological: Positive for speech difficulty and weakness. Negative for headaches.  Psychiatric/Behavioral: Negative for agitation and confusion.  All other systems reviewed and are negative.    Physical Exam Updated Vital Signs There were no vitals taken for this visit.  Physical Exam Vitals signs and nursing note reviewed.  Constitutional:      General: She is not in acute distress.    Appearance: She is not ill-appearing, toxic-appearing or diaphoretic.  HENT:     Head: Normocephalic.     Nose: No congestion or rhinorrhea.     Mouth/Throat:     Pharynx: No oropharyngeal exudate or posterior oropharyngeal erythema.  Eyes:     Extraocular Movements: Extraocular movements intact.     Conjunctiva/sclera: Conjunctivae normal.     Pupils: Pupils are equal, round, and reactive to light.  Neck:     Musculoskeletal: No muscular tenderness.  Cardiovascular:     Rate and Rhythm: Normal rate.     Pulses: Normal pulses.     Heart sounds: No murmur.  Pulmonary:     Effort: Pulmonary effort is normal.     Breath sounds: No stridor. No wheezing, rhonchi or rales.  Chest:     Chest wall: No tenderness.  Abdominal:     General: Abdomen is flat. There is no distension.     Tenderness: There is no abdominal tenderness.  Musculoskeletal:        General: No tenderness.  Skin:    Capillary Refill: Capillary refill takes less than 2 seconds.     Coloration: Skin is not jaundiced.  Neurological:     Mental Status: She is alert.     GCS: GCS eye subscore is 4. GCS verbal subscore is 1. GCS motor subscore is 6.     Sensory: Sensation is intact.     Motor: Weakness present.     Comments: Patient has expressive aphasia and is not answering any questions.  She will follow commands.  Patient had decreased grip strength in the right compared to left.  Patient would not move either of her legs.  Daughter reports that leg movement is generally weak but this  does seem worse bilaterally.  She does not appear to facial droop on my initial exam but is not smiling well.  Patient has not walked in some time per family, gait testing deferred.  Patient is spitting out secretions, unclear on her swallow.       ED Treatments / Results  Labs (all labs ordered are listed, but only abnormal results are displayed) Labs Reviewed  CBC WITH DIFFERENTIAL/PLATELET - Abnormal; Notable for the following components:      Result Value   WBC 12.8 (*)    HCT 46.4 (*)    Neutro Abs 9.9 (*)    Abs Immature Granulocytes 0.08 (*)  All other components within normal limits  COMPREHENSIVE METABOLIC PANEL - Abnormal; Notable for the following components:   Glucose, Bld 153 (*)    Creatinine, Ser 1.35 (*)    Total Protein 9.0 (*)    GFR calc non Af Amer 39 (*)    GFR calc Af Amer 46 (*)    All other components within normal limits  URINALYSIS, ROUTINE W REFLEX MICROSCOPIC - Abnormal; Notable for the following components:   APPearance HAZY (*)    All other components within normal limits  CBG MONITORING, ED - Abnormal; Notable for the following components:   Glucose-Capillary 135 (*)    All other components within normal limits  URINE CULTURE  LIPASE, BLOOD  TSH  RAPID URINE DRUG SCREEN, HOSP PERFORMED  AMMONIA  I-STAT TROPONIN, ED    EKG EKG Interpretation  Date/Time:  Tuesday December 06 2018 18:33:17 EST Ventricular Rate:  97 PR Interval:    QRS Duration: 92 QT Interval:  341 QTC Calculation: 434 R Axis:   -2 Text Interpretation:  Sinus rhythm No prior ECG for comparuson.  No STEMI Confirmed by Antony Blackbird [id number]) on 12/06/2018 7:10:18 PM   Radiology Dg Chest 2 View  Result Date: 12/06/2018 CLINICAL DATA:  73 y/o  F; AMS. EXAM: CHEST - 2 VIEW COMPARISON:  08/13/2015 chest radiograph FINDINGS: Stable normal cardiac silhouette. Aortic atherosclerosis with calcification. Clear lungs. No pleural effusion or pneumothorax. No acute osseous  abnormality is evident. IMPRESSION: No acute pulmonary process identified. Electronically Signed   By: Kristine Garbe M.D.   On: 12/06/2018 18:52   Mr Brain Wo Contrast  Result Date: 12/06/2018 CLINICAL DATA:  Altered mental status. EXAM: MRI HEAD WITHOUT CONTRAST TECHNIQUE: Multiplanar, multiecho pulse sequences of the brain and surrounding structures were obtained without intravenous contrast. COMPARISON:  Head CT 08/13/2015 FINDINGS: BRAIN: There is no acute infarct, acute hemorrhage, hydrocephalus or extra-axial collection. The midline structures are normal. No midline shift or other mass effect. Diffuse confluent hyperintense T2-weighted signal within the periventricular, deep and juxtacortical white matter, most commonly due to chronic ischemic microangiopathy. Bifrontal encephalomalacia of may be due to prior trauma or ischemia. There is diffuse, severe atrophy. 5-10 scattered foci of chronic microhemorrhage. VASCULAR: Major intracranial arterial and venous sinus flow voids are normal. SKULL AND UPPER CERVICAL SPINE: Remote left craniotomy. SINUSES/ORBITS: No fluid levels or advanced mucosal thickening. No mastoid or middle ear effusion. The orbits are normal. IMPRESSION: Chronic ischemic microangiopathy and severe volume loss without acute abnormality. Electronically Signed   By: Ulyses Jarred M.D.   On: 12/06/2018 20:20    Procedures Procedures (including critical care time)  Medications Ordered in ED Medications  sodium chloride 0.9 % bolus 500 mL (0 mLs Intravenous Stopped 12/06/18 2305)     Initial Impression / Assessment and Plan / ED Course  I have reviewed the triage vital signs and the nursing notes.  Pertinent labs & imaging results that were available during my care of the patient were reviewed by me and considered in my medical decision making (see chart for details).     Rachelann Palazzi is a 73 y.o. female with a past medical history significant for prior stroke,  hypertension, hyperlipidemia, dementia, GERD who presents from her nursing facility for concerns of possible suicidal ideation last week and then new neurologic deficits.  According to family who is accompanied patient, patient was speaking clearly normally last week.  They report that over the last few days patient has had decrease in her  speech and now is not saying any words.  Patient looks that she is trying to say words but cannot get anything out.  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  Patient reportedly told someone at her facility that she was having depression and suicidal ideation last week and she was started on a new depression medication several days ago.  She has had 3 days of this medication.  Family reports she has not had any recent fevers, chills, congestion, cough, shortness of breath, urinary symptoms or GI symptoms.  Patient was sent today due to the speech change in mental status change.  On exam, patient is spitting up all secretions.  Patient is unable to speak.  Patient has weakness on grip strength on right compared to left.  Patient will not move her legs.  Patient shakes her head that she has normal sensation in her extremities.  Patient does not appear to have facial droop.  Pupils are reactive and normal extraocular movements.  Patient appears to have expressive aphasia.  Lungs are clear and abdomen and chest are nontender.  Patient was initially triaged as a suicidal ideation however I am most concerned about stroke or other metabolic cause of her speech and mental status change.  Family reports that she is normally interactive and conversational and this is extremely different.  We will have pharmacy find out with med rec with the new depression medicine she was charted on several days ago however I am most concerned about recurrent stroke or  exacerbated stroke symptoms.  Patient will have work-up including laboratory testing urinalysis, chest x-ray, and will have MRI to look for stroke.  As symptoms have worsened over the last several days, do not feel she fits code stroke criteria at this time.  Anticipate admission.  9:19 PM Diagnostic laboratory testing began to return.  Mild leukocytosis but no anemia.  Troponin negative.  TSH normal.  Metabolic panel shows low increase in creatinine from prior.  Other electrolytes were reassuring.  Liver function normal.  Lipase not elevated.  Chest x-ray shows no pneumonia.  MRI brain shows chronic ischemic changes and severe volume loss with atrophy but no acute abnormality.  No stroke is seen today.  Spoke with neurology who felt this was likely related to a metabolic change causing reactivation of prior stroke symptoms based on the MRI findings.  Given the patient's mild leukocytosis, neurology suspected urinary source versus dehydration.  Patient will be given some fluids and in and out catheterization will be performed.  Due to the patient's inability to speak and her new intolerance of saliva was drooling, do not feel she is safe for discharge home.  Patient will need PT/OT and speech to see her to ensure swallowing safety.  Neurology agreed with further monitoring and if she does not improve tomorrow, they would recommend EEG.  Neurology was unclear if the new medication caused this or not.  After urinalysis, hospitalist team will be called for admission.  11:10 PM Urinalysis does not show admission.  Unclear etiology of the patient's speech abnormality.  Patient will need admission for likely EEG tomorrow.  Final Clinical Impressions(s) / ED Diagnoses   Final diagnoses:  Aphasia  Drooling    ED Discharge Orders    None     Clinical Impression: 1. Aphasia   2. Drooling     Disposition: Admit  This note  was prepared with assistance of Systems analyst.  Occasional wrong-word or sound-a-like substitutions may have occurred due to the inherent limitations of voice recognition software.     Laelia Angelo, Gwenyth Allegra, MD 12/06/18 [phone]

## 2018-12-07 ENCOUNTER — Other Ambulatory Visit: Payer: Self-pay

## 2018-12-07 ENCOUNTER — Encounter (HOSPITAL_COMMUNITY): Payer: Self-pay

## 2018-12-07 ENCOUNTER — Inpatient Hospital Stay (HOSPITAL_COMMUNITY)
Admit: 2018-12-07 | Discharge: 2018-12-07 | Disposition: A | Discharge status: Home / Self Care | Payer: Medicare Other | Attending: Internal Medicine | Admitting: Internal Medicine

## 2018-12-07 ENCOUNTER — Inpatient Hospital Stay (HOSPITAL_COMMUNITY): Payer: Medicare Other

## 2018-12-07 DIAGNOSIS — R4701 Aphasia: Secondary | ICD-10-CM | POA: Diagnosis not present

## 2018-12-07 DIAGNOSIS — R05 Cough: Secondary | ICD-10-CM

## 2018-12-07 DIAGNOSIS — G9341 Metabolic encephalopathy: Secondary | ICD-10-CM | POA: Diagnosis not present

## 2018-12-07 DIAGNOSIS — G9340 Encephalopathy, unspecified: Secondary | ICD-10-CM

## 2018-12-07 DIAGNOSIS — I10 Essential (primary) hypertension: Secondary | ICD-10-CM | POA: Diagnosis present

## 2018-12-07 DIAGNOSIS — R5381 Other malaise: Secondary | ICD-10-CM | POA: Diagnosis present

## 2018-12-07 DIAGNOSIS — F329 Major depressive disorder, single episode, unspecified: Secondary | ICD-10-CM | POA: Diagnosis present

## 2018-12-07 DIAGNOSIS — R569 Unspecified convulsions: Secondary | ICD-10-CM | POA: Insufficient documentation

## 2018-12-07 DIAGNOSIS — F32A Depression, unspecified: Secondary | ICD-10-CM | POA: Diagnosis present

## 2018-12-07 LAB — CBC
HCT: 38.3 % (ref 36.0–46.0)
HEMOGLOBIN: 12.2 g/dL (ref 12.0–15.0)
MCH: 31.4 pg (ref 26.0–34.0)
MCHC: 31.9 g/dL (ref 30.0–36.0)
MCV: 98.5 fL (ref 80.0–100.0)
Platelets: 178 10*3/uL (ref 150–400)
RBC: 3.89 MIL/uL (ref 3.87–5.11)
RDW: 14.1 % (ref 11.5–15.5)
WBC: 12.3 10*3/uL — ABNORMAL HIGH (ref 4.0–10.5)
nRBC: 0 % (ref 0.0–0.2)

## 2018-12-07 LAB — COMPREHENSIVE METABOLIC PANEL
ALT: 25 U/L (ref 0–44)
AST: 26 U/L (ref 15–41)
Albumin: 3.4 g/dL — ABNORMAL LOW (ref 3.5–5.0)
Alkaline Phosphatase: 65 U/L (ref 38–126)
Anion gap: 8 (ref 5–15)
BUN: 15 mg/dL (ref 8–23)
CHLORIDE: 107 mmol/L (ref 98–111)
CO2: 23 mmol/L (ref 22–32)
Calcium: 9 mg/dL (ref 8.9–10.3)
Creatinine, Ser: 1.17 mg/dL — ABNORMAL HIGH (ref 0.44–1.00)
GFR calc Af Amer: 54 mL/min — ABNORMAL LOW (ref >60)
GFR calc non Af Amer: 47 mL/min — ABNORMAL LOW (ref >60)
Glucose, Bld: 145 mg/dL — ABNORMAL HIGH (ref 70–99)
Potassium: 4.3 mmol/L (ref 3.5–5.1)
Sodium: 138 mmol/L (ref 135–145)
Total Bilirubin: 0.7 mg/dL (ref 0.3–1.2)
Total Protein: 7.6 g/dL (ref 6.5–8.1)

## 2018-12-07 LAB — INFLUENZA PANEL BY PCR (TYPE A & B)
Influenza A By PCR: NEGATIVE
Influenza B By PCR: NEGATIVE

## 2018-12-07 LAB — PROCALCITONIN: PROCALCITONIN: 0.4 ng/mL

## 2018-12-07 LAB — MRSA PCR SCREENING: MRSA by PCR: NEGATIVE

## 2018-12-07 MED ORDER — ENOXAPARIN SODIUM 40 MG/0.4ML ~~LOC~~ SOLN
40.0000 mg | SUBCUTANEOUS | Status: DC
  Administered 2018-12-07 – 2018-12-09 (×3): 40 mg via SUBCUTANEOUS
  Filled 2018-12-07 (×4): qty 0.4

## 2018-12-07 MED ORDER — ACETAMINOPHEN 325 MG PO TABS: 650.0000 mg | ORAL_TABLET | Freq: Four times a day (QID) | ORAL | Status: DC | PRN

## 2018-12-07 MED ORDER — ACETAMINOPHEN 650 MG RE SUPP
650.0000 mg | Freq: Four times a day (QID) | RECTAL | Status: DC | PRN
  Administered 2018-12-07: 650 mg via RECTAL
  Filled 2018-12-07: qty 1

## 2018-12-07 MED ORDER — GUAIFENESIN-DM 100-10 MG/5ML PO SYRP
5.0000 mL | ORAL_SOLUTION | ORAL | Status: DC | PRN
  Administered 2018-12-07 – 2018-12-09 (×3): 5 mL via ORAL
  Filled 2018-12-07 (×3): qty 10

## 2018-12-07 MED ORDER — SODIUM CHLORIDE 0.9 % IV SOLN
INTRAVENOUS | Status: DC
  Administered 2018-12-07 – 2018-12-08 (×4): via INTRAVENOUS

## 2018-12-07 MED ORDER — ONDANSETRON HCL 4 MG/2ML IJ SOLN: 4.0000 mg | Freq: Four times a day (QID) | INTRAMUSCULAR | Status: DC | PRN

## 2018-12-07 MED ORDER — ZINC OXIDE 40 % EX OINT
TOPICAL_OINTMENT | Freq: Four times a day (QID) | CUTANEOUS | Status: DC
  Administered 2018-12-07 – 2018-12-09 (×9): via TOPICAL
  Filled 2018-12-07: qty 57

## 2018-12-07 MED ORDER — ONDANSETRON HCL 4 MG PO TABS: 4.0000 mg | ORAL_TABLET | Freq: Four times a day (QID) | ORAL | Status: DC | PRN

## 2018-12-07 NOTE — ED Notes (Signed)
Lost connection on phone to RN on 5E.  Will try to call again shortly.

## 2018-12-07 NOTE — ED Notes (Signed)
ED TO INPATIENT HANDOFF REPORT  Name/Age/Gender Mariah Mann 73 y.o. female  Code Status    Code Status Orders  (From admission, onward)         Start     Ordered   12/07/18 0124  Full code  Continuous     12/07/18 0124        Code Status History    This patient has a current code status but no historical code status.      Home/SNF/Other Home  Chief Complaint Mental Evaluation  Level of Care/Admitting Diagnosis ED Disposition    ED Disposition Condition Comment   Admit  Hospital Area: [facility] [100102]  Level of Care: Med-Surg [16]  Diagnosis: Acute encephalopathy [347425]  Admitting Physician: Doreatha Massed  Attending Physician: Etta Quill [phone]  Estimated length of stay: past midnight tomorrow  Certification:: I certify this patient will need inpatient services for at least 2 midnights  PT Class (Do Not Modify): Inpatient [101]  PT Acc Code (Do Not Modify): Private [1]       Medical History Past Medical History:  Diagnosis Date  . Arthritis   . Cancer ([place])   . Chronic kidney disease   . Dementia ([place])   . Depression   . Diabetes mellitus without complication ([place])   . Epilepsy ([place])   . GERD (gastroesophageal reflux disease)   . Gout   . Hyperlipidemia   . Hypertension   . Renal disorder   . Seizures ([place])   . Stroke ([place])   . Urinary incontinence     Allergies Allergies  Allergen Reactions  . Contrast Media [Iodinated Diagnostic Agents] Other (See Comments)  . Iodine Other (See Comments)  . Keflex [Cephalexin] Other (See Comments)  . Macrodantin [Nitrofurantoin] Other (See Comments)  . Penicillins Other (See Comments)    IV Location/Drains/Wounds Patient Lines/Drains/Airways Status   Active Line/Drains/Airways    Name:   Placement date:   Placement time:   Site:   Days:   Peripheral IV 12/06/18 Right Forearm   12/06/18    1825    Forearm   1          Labs/Imaging Results for orders  placed or performed during the hospital encounter of 12/06/18 (from the past 48 hour(s))  Urinalysis, Routine w reflex microscopic     Status: Abnormal   Collection Time: 12/06/18  5:40 PM  Result Value Ref Range   Color, Urine YELLOW YELLOW   APPearance HAZY (A) CLEAR   Specific Gravity, Urine 1.018 1.005 - 1.030   pH 5.0 5.0 - 8.0   Glucose, UA NEGATIVE NEGATIVE mg/dL   Hgb urine dipstick NEGATIVE NEGATIVE   Bilirubin Urine NEGATIVE NEGATIVE   Ketones, ur NEGATIVE NEGATIVE mg/dL   Protein, ur NEGATIVE NEGATIVE mg/dL   Nitrite NEGATIVE NEGATIVE   Leukocytes, UA NEGATIVE NEGATIVE    Comment: Performed at [facility], [place] [address] [place], [place] [address]  Rapid urine drug screen (hospital performed)     Status: None   Collection Time: 12/06/18  5:40 PM  Result Value Ref Range   Opiates NONE DETECTED NONE DETECTED   Cocaine NONE DETECTED NONE DETECTED   Benzodiazepines NONE DETECTED NONE DETECTED   Amphetamines NONE DETECTED NONE DETECTED   Tetrahydrocannabinol NONE DETECTED NONE DETECTED   Barbiturates NONE DETECTED NONE DETECTED    Comment: (NOTE) DRUG SCREEN FOR MEDICAL PURPOSES ONLY.  IF CONFIRMATION IS NEEDED FOR ANY PURPOSE, NOTIFY LAB WITHIN 5  DAYS. LOWEST DETECTABLE LIMITS FOR URINE DRUG SCREEN Drug Class                     Cutoff (ng/mL) Amphetamine and metabolites    1000 Barbiturate and metabolites    200 Benzodiazepine                 389 Tricyclics and metabolites     300 Opiates and metabolites        300 Cocaine and metabolites        300 THC                            50 Performed at [facility] [address] [place], [place] [address]   CBC with Differential     Status: Abnormal   Collection Time: 12/06/18  6:23 PM  Result Value Ref Range   WBC 12.8 (H) 4.0 - 10.5 K/uL   RBC 4.70 3.87 - 5.11 MIL/uL   Hemoglobin 14.6 12.0 - 15.0 g/dL   HCT 46.4 (H) 36.0 - 46.0 %   MCV 98.7 80.0 - 100.0 fL   MCH 31.1  26.0 - 34.0 pg   MCHC 31.5 30.0 - 36.0 g/dL   RDW 14.2 11.5 - 15.5 %   Platelets 190 150 - 400 K/uL   nRBC 0.0 0.0 - 0.2 %   Neutrophils Relative % 77 %   Neutro Abs 9.9 (H) 1.7 - 7.7 K/uL   Lymphocytes Relative 15 %   Lymphs Abs 2.0 0.7 - 4.0 K/uL   Monocytes Relative 6 %   Monocytes Absolute 0.8 0.1 - 1.0 K/uL   Eosinophils Relative 1 %   Eosinophils Absolute 0.1 0.0 - 0.5 K/uL   Basophils Relative 0 %   Basophils Absolute 0.0 0.0 - 0.1 K/uL   Immature Granulocytes 1 %   Abs Immature Granulocytes 0.08 (H) 0.00 - 0.07 K/uL    Comment: Performed at [facility], [place] [address] [place], [place] [address]  Comprehensive metabolic panel     Status: Abnormal   Collection Time: 12/06/18  6:23 PM  Result Value Ref Range   Sodium 138 135 - 145 mmol/L   Potassium 4.5 3.5 - 5.1 mmol/L   Chloride 103 98 - 111 mmol/L   CO2 24 22 - 32 mmol/L   Glucose, Bld 153 (H) 70 - 99 mg/dL   BUN 15 8 - 23 mg/dL   Creatinine, Ser 1.35 (H) 0.44 - 1.00 mg/dL   Calcium 10.1 8.9 - 10.3 mg/dL   Total Protein 9.0 (H) 6.5 - 8.1 g/dL   Albumin 4.0 3.5 - 5.0 g/dL   AST 27 15 - 41 U/L   ALT 29 0 - 44 U/L   Alkaline Phosphatase 78 38 - 126 U/L   Total Bilirubin 0.9 0.3 - 1.2 mg/dL   GFR calc non Af Amer 39 (L) >60 mL/min   GFR calc Af Amer 46 (L) >60 mL/min   Anion gap 11 5 - 15    Comment: Performed at [facility], [place] [address] [place], [place] [address]  Lipase, blood     Status: None   Collection Time: 12/06/18  6:23 PM  Result Value Ref Range   Lipase 42 11 - 51 U/L    Comment: Performed at [facility], [place] [address], [place], [place] [address]  TSH     Status: None  Collection Time: 12/06/18  6:23 PM  Result Value Ref Range   TSH 1.421 0.350 - 4.500 uIU/mL    Comment: Performed by a 3rd Generation assay with a functional sensitivity of <=0.01 uIU/mL. Performed at [facility], [place] [address] [place], [place]  [address]   Ammonia     Status: None   Collection Time: 12/06/18  6:23 PM  Result Value Ref Range   Ammonia 17 9 - 35 umol/L    Comment: Performed at [facility], [place] [address], [place], [place] [address]  I-Stat Troponin, ED (not at [facility])     Status: None   Collection Time: 12/06/18  6:26 PM  Result Value Ref Range   Troponin i, poc 0.00 0.00 - 0.08 ng/mL   Comment 3            Comment: Due to the release kinetics of cTnI, a negative result within the first hours of the onset of symptoms does not rule out myocardial infarction with certainty. If myocardial infarction is still suspected, repeat the test at appropriate intervals.   POC CBG, ED     Status: Abnormal   Collection Time: 12/06/18  6:55 PM  Result Value Ref Range   Glucose-Capillary 135 (H) 70 - 99 mg/dL  Comprehensive metabolic panel     Status: Abnormal   Collection Time: 12/07/18  5:55 AM  Result Value Ref Range   Sodium 138 135 - 145 mmol/L   Potassium 4.3 3.5 - 5.1 mmol/L   Chloride 107 98 - 111 mmol/L   CO2 23 22 - 32 mmol/L   Glucose, Bld 145 (H) 70 - 99 mg/dL   BUN 15 8 - 23 mg/dL   Creatinine, Ser 1.17 (H) 0.44 - 1.00 mg/dL   Calcium 9.0 8.9 - 10.3 mg/dL   Total Protein 7.6 6.5 - 8.1 g/dL   Albumin 3.4 (L) 3.5 - 5.0 g/dL   AST 26 15 - 41 U/L   ALT 25 0 - 44 U/L   Alkaline Phosphatase 65 38 - 126 U/L   Total Bilirubin 0.7 0.3 - 1.2 mg/dL   GFR calc non Af Amer 47 (L) >60 mL/min   GFR calc Af Amer 54 (L) >60 mL/min   Anion gap 8 5 - 15    Comment: Performed at [facility], [place] [address] [place], [place] [address]  CBC     Status: Abnormal   Collection Time: 12/07/18  5:55 AM  Result Value Ref Range   WBC 12.3 (H) 4.0 - 10.5 K/uL   RBC 3.89 3.87 - 5.11 MIL/uL   Hemoglobin 12.2 12.0 - 15.0 g/dL   HCT 38.3 36.0 - 46.0 %   MCV 98.5 80.0 - 100.0 fL   MCH 31.4 26.0 - 34.0 pg   MCHC 31.9 30.0 - 36.0 g/dL   RDW 14.1 11.5 - 15.5 %   Platelets 178 150 - 400 K/uL    nRBC 0.0 0.0 - 0.2 %    Comment: Performed at [facility], [place] [address] [place], [place] [address]  Procalcitonin - Baseline     Status: None   Collection Time: 12/07/18  9:08 AM  Result Value Ref Range   Procalcitonin 0.40 ng/mL    Comment:        Interpretation: PCT (Procalcitonin) <= 0.5 ng/mL: Systemic infection (sepsis) is not likely. Local bacterial infection is possible. (NOTE)       Sepsis PCT Algorithm  Lower Respiratory Tract                                      Infection PCT Algorithm    ----------------------------     ----------------------------         PCT < 0.25 ng/mL                PCT < 0.10 ng/mL         Strongly encourage             Strongly discourage   discontinuation of antibiotics    initiation of antibiotics    ----------------------------     -----------------------------       PCT 0.25 - 0.50 ng/mL            PCT 0.10 - 0.25 ng/mL               OR       >80% decrease in PCT            Discourage initiation of                                            antibiotics      Encourage discontinuation           of antibiotics    ----------------------------     -----------------------------         PCT >= 0.50 ng/mL              PCT 0.26 - 0.50 ng/mL               AND        <80% decrease in PCT             Encourage initiation of                                             antibiotics       Encourage continuation           of antibiotics    ----------------------------     -----------------------------        PCT >= 0.50 ng/mL                  PCT > 0.50 ng/mL               AND         increase in PCT                  Strongly encourage                                      initiation of antibiotics    Strongly encourage escalation           of antibiotics                                     -----------------------------  PCT <= 0.25 ng/mL                                                  OR                                        > 80% decrease in PCT                                     Discontinue / Do not initiate                                             antibiotics Performed at [facility] [address] [place], [place] [address]    Dg Chest 2 View  Result Date: 12/06/2018 CLINICAL DATA:  73 y/o  F; AMS. EXAM: CHEST - 2 VIEW COMPARISON:  08/13/2015 chest radiograph FINDINGS: Stable normal cardiac silhouette. Aortic atherosclerosis with calcification. Clear lungs. No pleural effusion or pneumothorax. No acute osseous abnormality is evident. IMPRESSION: No acute pulmonary process identified. Electronically Signed   By: Kristine Garbe M.D.   On: 12/06/2018 18:52   Mr Brain Wo Contrast  Result Date: 12/06/2018 CLINICAL DATA:  Altered mental status. EXAM: MRI HEAD WITHOUT CONTRAST TECHNIQUE: Multiplanar, multiecho pulse sequences of the brain and surrounding structures were obtained without intravenous contrast. COMPARISON:  Head CT 08/13/2015 FINDINGS: BRAIN: There is no acute infarct, acute hemorrhage, hydrocephalus or extra-axial collection. The midline structures are normal. No midline shift or other mass effect. Diffuse confluent hyperintense T2-weighted signal within the periventricular, deep and juxtacortical white matter, most commonly due to chronic ischemic microangiopathy. Bifrontal encephalomalacia of may be due to prior trauma or ischemia. There is diffuse, severe atrophy. 5-10 scattered foci of chronic microhemorrhage. VASCULAR: Major intracranial arterial and venous sinus flow voids are normal. SKULL AND UPPER CERVICAL SPINE: Remote left craniotomy. SINUSES/ORBITS: No fluid levels or advanced mucosal thickening. No mastoid or middle ear effusion. The orbits are normal. IMPRESSION: Chronic ischemic microangiopathy and severe volume loss without acute abnormality. Electronically Signed   By: Ulyses Jarred M.D.   On: 12/06/2018 20:20   Dg  Chest Port 1 View  Result Date: 12/07/2018 CLINICAL DATA:  Cough EXAM: PORTABLE CHEST 1 VIEW COMPARISON:  Yesterday FINDINGS: Normal heart size. Low lung volumes with interstitial crowding. There is no edema, consolidation, effusion, or pneumothorax. IMPRESSION: Low volume chest without acute finding. Electronically Signed   By: Monte Fantasia M.D.   On: 12/07/2018 05:28    Pending Labs Unresulted Labs (From admission, onward)    Start     Ordered   12/08/18 0500  Procalcitonin  Daily,   R     12/07/18 0907   12/07/18 1238  Influenza panel by PCR (type A & B)  ONCE - STAT,   R     12/07/18 1237   12/07/18 0907  MRSA PCR Screening  Once,   R     12/07/18 0906   12/07/18 0907  Culture, blood (routine x 2)  BLOOD CULTURE X 2,  R     12/07/18 0906   12/06/18 1740  Urine culture  ONCE - STAT,   STAT     12/06/18 1740          Vitals/Pain Today's Vitals   12/07/18 1000 12/07/18 1125 12/07/18 1201 12/07/18 1257  BP: 127/69 (!) 142/74 (!) 146/77 (!) 148/78  Pulse: 88 81 84 80  Resp: 17 17 16 19   Temp:      TempSrc:      SpO2: 96% 95% 97% 97%  PainSc:        Isolation Precautions No active isolations  Medications Medications  0.9 %  sodium chloride infusion ( Intravenous New Bag/Given 12/07/18 0228)  acetaminophen (TYLENOL) tablet 650 mg ( Oral See Alternative 12/07/18 0704)    Or  acetaminophen (TYLENOL) suppository 650 mg (650 mg Rectal Given 12/07/18 0704)  ondansetron (ZOFRAN) tablet 4 mg (has no administration in time range)    Or  ondansetron (ZOFRAN) injection 4 mg (has no administration in time range)  enoxaparin (LOVENOX) injection 40 mg (has no administration in time range)  liver oil-zinc oxide (DESITIN) 40 % ointment (has no administration in time range)  sodium chloride 0.9 % bolus 500 mL (0 mLs Intravenous Stopped 12/06/18 2305)    Mobility non-ambulatory

## 2018-12-07 NOTE — ED Notes (Signed)
EEG from [facility] called.  A tech will be here in 30 min to do EEG

## 2018-12-07 NOTE — Progress Notes (Signed)
EEG Completed; Results Pending  

## 2018-12-07 NOTE — Consult Note (Signed)
[facility] Nurse wound consult note Patient receiving care in [facility] ED room 22.  Assisted with assessment by NT. Reason for Consult: sacral wound Wound type: There is NO wound on the sacrum.  The right buttock is affected by MASD from urinary and fecal incontinence and there is a very small (approximately 0.5 cm x 0.5 cm) abraded area, and scattered spots of healed, hypopigmented areas.  A Purewick is currently in use. Dressing procedure/placement/frequency: Desitin 40% to affected areas. Monitor the wound area(s) for worsening of condition such as: Signs/symptoms of infection,  Increase in size,  Development of or worsening of odor, Development of pain, or increased pain at the affected locations.  Notify the medical team if any of these develop.  Thank you for the consult.  Discussed plan of care with the patient and bedside nurse.  [facility] nurse will not follow at this time.  Please re-consult the [facility] team if needed.  Val Riles, RN, MSN, CWOCN, CNS-BC, pager [phone]

## 2018-12-07 NOTE — Progress Notes (Signed)
Pt was able to swallow thin liquids but when given applesauce pt would not swallow and held in mouth until she spit it out after directed by RN.  NPO per screening.  Danton Clap, RN

## 2018-12-07 NOTE — H&P (Signed)
History and Physical    Mariah Mann GMW:[id number] DOB: October 01, 1946 DOA: 12/06/2018  PCP: Garwin Brothers, MD  Patient coming from: SNF  I have personally briefly reviewed patient's old medical records in [facility]  Chief Complaint: AMS  HPI: Mariah Mann is a 73 y.o. female with medical history significant of prior stroke, HTN, HLD, dementia.  Patient presents to the ED with concern for not speaking and decreased speech over the past couple of days.  According to daughter at bedside she was speaking clearly last week.  Due to concern for depression (SI even) at the SNF last week, she was started on zoloft x3 days ago.  Over the past 3 days she has had decreased speaking.  This is new per family.  Several years ago she had stroke causing R sided hemiplegia.  She has been sent in to the ED due to speech / mental status change.   ED Course: Patient also seems to have cough / spitting up secretions.  This is also new per daughter.  CXR neg, WBC 12.9k.  UA neg.  Satting 97% on RA.   Review of Systems: Unable to perform due to AMS.  Past Medical History:  Diagnosis Date  . Arthritis   . Cancer ([place])   . Dementia ([place])   . Depression   . GERD (gastroesophageal reflux disease)   . Gout   . Hyperlipidemia   . Hypertension   . Renal disorder   . Seizures ([place])   . Stroke [facility])     History reviewed. No pertinent surgical history.   has an unknown smoking status. She has never used smokeless tobacco. No history on file for alcohol and drug.  Allergies  Allergen Reactions  . Contrast Media [Iodinated Diagnostic Agents] Other (See Comments)  . Iodine Other (See Comments)  . Keflex [Cephalexin] Other (See Comments)  . Macrodantin [Nitrofurantoin] Other (See Comments)  . Penicillins Other (See Comments)    No family history on file. Patient unable to provide due to AMS.  Prior to Admission medications   Medication Sig Start Date End Date Taking? Authorizing Provider   acetaminophen (TYLENOL) 650 MG CR tablet Take 650 mg by mouth every 6 (six) hours as needed for pain.   Yes [provider]  allopurinol (ZYLOPRIM) 100 MG tablet Take 100 mg by mouth daily.   Yes [provider]  Ascorbic Acid (VITAMIN C PO) Take 1 tablet by mouth daily.   Yes [provider]  cholecalciferol (D-VI-SOL) 10 MCG/ML LIQD Take 2,000 Units by mouth daily.   Yes [provider]  donepezil (ARICEPT) 10 MG tablet Take 10 mg by mouth daily.    Yes [provider]  Emollient (EUCERIN EX) Apply 1 application topically 2 (two) times daily. Apply to both legs and feet   Yes [provider]  losartan (COZAAR) 25 MG tablet Take 25 mg by mouth daily.   Yes [provider]  sertraline (ZOLOFT) 50 MG tablet Take 50 mg by mouth at bedtime.   Yes [provider]  vitamin B-12 (CYANOCOBALAMIN) 1000 MCG tablet Take 1,000 mcg by mouth daily.   Yes [provider]    Physical Exam: Vitals:   12/06/18 2300 12/06/18 2315 12/07/18 0000 12/07/18 0100  BP: (!) 141/82  (!) 145/81 (!) 149/88  Pulse:  93 91 94  Resp: 20 (!) 21 (!) 21 17  SpO2:  99% 96% 97%    Constitutional: NAD, calm, comfortable Eyes: PERRL, lids and conjunctivae  normal ENMT: Mucous membranes are moist. Posterior pharynx clear of any exudate or lesions.Normal dentition.  Neck: normal, supple, no masses, no thyromegaly Respiratory: clear to auscultation bilaterally, no wheezing, no crackles. Normal respiratory effort. No accessory muscle use.  Cardiovascular: Regular rate and rhythm, no murmurs / rubs / gallops. No extremity edema. 2+ pedal pulses. No carotid bruits.  Abdomen: no tenderness, no masses palpated. No hepatosplenomegaly. Bowel sounds positive.  Musculoskeletal: no clubbing / cyanosis. No joint deformity upper and lower extremities. Good ROM, no contractures. Normal muscle tone.  Skin: Stage 1 sacral decubitus, no obvious erythema, exudate,  etc. Neurologic: She occasionally will answer questions with one word but otherwise doesn't say much.  R sided grip strength diminished compared to L Psychiatric: Minimally verbal   Labs on Admission: I have personally reviewed following labs and imaging studies  CBC: Recent Labs  Lab 12/06/18 1823  WBC 12.8*  NEUTROABS 9.9*  HGB 14.6  HCT 46.4*  MCV 98.7  PLT 161   Basic Metabolic Panel: Recent Labs  Lab 12/06/18 1823  NA 138  K 4.5  CL 103  CO2 24  GLUCOSE 153*  BUN 15  CREATININE 1.35*  CALCIUM 10.1   GFR: CrCl cannot be calculated (Unknown ideal weight.). Liver Function Tests: Recent Labs  Lab 12/06/18 1823  AST 27  ALT 29  ALKPHOS 78  BILITOT 0.9  PROT 9.0*  ALBUMIN 4.0   Recent Labs  Lab 12/06/18 1823  LIPASE 42   Recent Labs  Lab 12/06/18 1823  AMMONIA 17   Coagulation Profile: No results for input(s): INR, PROTIME in the last 168 hours. Cardiac Enzymes: No results for input(s): CKTOTAL, CKMB, CKMBINDEX, TROPONINI in the last 168 hours. BNP (last 3 results) No results for input(s): PROBNP in the last 8760 hours. HbA1C: No results for input(s): HGBA1C in the last 72 hours. CBG: Recent Labs  Lab 12/06/18 1855  GLUCAP 135*   Lipid Profile: No results for input(s): CHOL, HDL, LDLCALC, TRIG, CHOLHDL, LDLDIRECT in the last 72 hours. Thyroid Function Tests: Recent Labs    12/06/18 1823  TSH 1.421   Anemia Panel: No results for input(s): VITAMINB12, FOLATE, FERRITIN, TIBC, IRON, RETICCTPCT in the last 72 hours. Urine analysis:    Component Value Date/Time   COLORURINE YELLOW 12/06/2018 1740   APPEARANCEUR HAZY (A) 12/06/2018 1740   LABSPEC 1.018 12/06/2018 1740   PHURINE 5.0 12/06/2018 1740   GLUCOSEU NEGATIVE 12/06/2018 1740   HGBUR NEGATIVE 12/06/2018 1740   BILIRUBINUR NEGATIVE 12/06/2018 1740   KETONESUR NEGATIVE 12/06/2018 1740   PROTEINUR NEGATIVE 12/06/2018 1740   UROBILINOGEN 1.0 08/13/2015 0244   NITRITE NEGATIVE  12/06/2018 1740   LEUKOCYTESUR NEGATIVE 12/06/2018 1740    Radiological Exams on Admission: Dg Chest 2 View  Result Date: 12/06/2018 CLINICAL DATA:  73 y/o  F; AMS. EXAM: CHEST - 2 VIEW COMPARISON:  08/13/2015 chest radiograph FINDINGS: Stable normal cardiac silhouette. Aortic atherosclerosis with calcification. Clear lungs. No pleural effusion or pneumothorax. No acute osseous abnormality is evident. IMPRESSION: No acute pulmonary process identified. Electronically Signed   By: Kristine Garbe M.D.   On: 12/06/2018 18:52   Mr Brain Wo Contrast  Result Date: 12/06/2018 CLINICAL DATA:  Altered mental status. EXAM: MRI HEAD WITHOUT CONTRAST TECHNIQUE: Multiplanar, multiecho pulse sequences of the brain and surrounding structures were obtained without intravenous contrast. COMPARISON:  Head CT 08/13/2015 FINDINGS: BRAIN: There is no acute infarct, acute hemorrhage, hydrocephalus or extra-axial collection. The midline structures are normal. No midline shift  or other mass effect. Diffuse confluent hyperintense T2-weighted signal within the periventricular, deep and juxtacortical white matter, most commonly due to chronic ischemic microangiopathy. Bifrontal encephalomalacia of may be due to prior trauma or ischemia. There is diffuse, severe atrophy. 5-10 scattered foci of chronic microhemorrhage. VASCULAR: Major intracranial arterial and venous sinus flow voids are normal. SKULL AND UPPER CERVICAL SPINE: Remote left craniotomy. SINUSES/ORBITS: No fluid levels or advanced mucosal thickening. No mastoid or middle ear effusion. The orbits are normal. IMPRESSION: Chronic ischemic microangiopathy and severe volume loss without acute abnormality. Electronically Signed   By: Ulyses Jarred M.D.   On: 12/06/2018 20:20    EKG: Independently reviewed.  Assessment/Plan Principal Problem:   Acute encephalopathy Active Problems:   HTN (hypertension)   Debility   Depression    1. Acute encephalopathy  - 1. MRI brain without acute stroke findings 2. UA, CXR neG 3. Unclear cause, URI maybe? Seizures? New medication induced? Certainly doesn't look like serotonin syndrome. 4. Observe for fever 5. EEG adult 6. Hold Zoloft which was just started 3 days ago (actually holding all PO meds anyhow until SLP eval) 7. Repeat CXR in AM 8. IVF 9. NPO pending SLP eval 10. PT/OT 11. Repeat CBC/CMP in AM 2. HTN - 1. Holding home PO meds 3. Debility - 1. PT/OT 2. Bed/wheelchair bound at baseline 3. Has stage 1 sacral decubitus, getting wound care to take a look but doesn't look grossly infected on exam.  DVT prophylaxis: Lovenox Code Status: Full Family Communication: Daughter at bedside Disposition Plan: SNF after admit Consults called: None Admission status: Admit to inpatient  Severity of Illness: The appropriate patient status for this patient is INPATIENT. Inpatient status is judged to be reasonable and necessary in order to provide the required intensity of service to ensure the patient's safety. The patient's presenting symptoms, physical exam findings, and initial radiographic and laboratory data in the context of their chronic comorbidities is felt to place them at high risk for further clinical deterioration. Furthermore, it is not anticipated that the patient will be medically stable for discharge from the hospital within 2 midnights of admission. The following factors support the patient status of inpatient.   " The patient's presenting symptoms include Acute encephalopathy. " The worrisome physical exam findings include Decreased speech, new onset aphasia.   * I certify that at the point of admission it is my clinical judgment that the patient will require inpatient hospital care spanning beyond 2 midnights from the point of admission due to high intensity of service, high risk for further deterioration and high frequency of surveillance required.Etta Quill DO [facility] Pager [phone] Only works nights!  If 7AM-7PM, please contact the primary day team physician taking care of patient  [email or web address] Password [email or web address]  12/07/2018, 1:27 AM

## 2018-12-07 NOTE — Evaluation (Signed)
Clinical/Bedside Swallow Evaluation Patient Details  Name: Mariah Mann MRN: [id number] Date of Birth: [birth year]  Today's Date: 12/07/2018 Time: SLP Start Time (ACUTE ONLY): 1414 SLP Stop Time (ACUTE ONLY): 1429 SLP Time Calculation (min) (ACUTE ONLY): 15 min  Past Medical History:  Past Medical History:  Diagnosis Date  . Arthritis   . Cancer ([place])   . Chronic kidney disease   . Dementia ([place])   . Depression   . Diabetes mellitus without complication ([place])   . Epilepsy ([place])   . GERD (gastroesophageal reflux disease)   . Gout   . Hyperlipidemia   . Hypertension   . Renal disorder   . Seizures ([place])   . Stroke ([place])   . Urinary incontinence    Past Surgical History: History reviewed. No pertinent surgical history. HPI:  Ms Mariah Mann is a 73 yo female with worsening aphasia x2 days and possible suicidal ideation a few days ago at facility.  Per MD note, pt was started on Zoloft a few days ago and now with worsening aphasia/mental status.  Pt found to have increased creatinine and to be encephalopathic.  Per family report to MD, pt has been spitting up secretions and this is a new occurence.  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  CXR 12/06/2018 negative, CXR 12/07/2018 negative, Low lung volumes.  Electroencephalogram completed today was abnormal secondary to increased left hemispheric amplitude and intermittent left frontal sharp transients.  Patient with a history of left craniotomy per neurologist note.  RN came during SLP eval to test pt for flur.  Swallow eval ordered.     Assessment / Plan / Recommendation Clinical Impression  Pt aphasic and not following directions consistently but participative.  Upon inspection of oral cavity, viscous yellow tinged secretions retained = which pt did expectorate upon SLP verbal cue.  She did not state why she was holding secretion in her mouth when asked.   Since she has a  purewick in place, oral suction use not available.  RN reports pt orally holding applesauce but tolerating thin liquids.  Sitter present who fed pt breakfast denied pt having coughing with intake.  Congested cough noted at baseline that worsened with intake of solids/icecream- ? due to secretions retained in pharynx mixing with boluses and spilling into airway.  No overt indication of aspiration with thin via straw.  Recommend pt start a clear liquid diet with strict precautions and MBS next date to allow viewing of musculature/physiology of swallow to hopefully rule out a significant pharyngeal deficit.  SLP will follow up tomorrow clinically to determine readiness/indication for MBS.  Pt and sitter informed and swallow precaution sign sent to floor.   SLP Visit Diagnosis: Dysphagia, unspecified (R13.10)    Aspiration Risk  Moderate aspiration risk;Risk for inadequate nutrition/hydration    Diet Recommendation Thin liquid(clears)   Liquid Administration via: Cup;Straw Medication Administration: Other (Comment)(try with icecream or pudding, pt states she dislikes applesauce) Supervision: Staff to assist with self feeding Compensations: Slow rate;Small sips/bites Postural Changes: Seated upright at 90 degrees;Remain upright for at least 30 minutes after po intake    Other  Recommendations Oral Care Recommendations: Oral care prior to ice chip/H20(check for secretion retention, instruct pt to expectorate prn)   Follow up Recommendations Skilled Nursing facility(tbd)      Frequency and Duration min 1 x/week  1 week       Prognosis Prognosis for Safe Diet Advancement:  Fair Barriers to Reach Goals: Language deficits      Swallow Study   General Date of Onset: 12/07/18 HPI: Ms Mariah Mann is a 73 yo female with worsening aphasia x2 days and possible suicidal ideation a few days ago at facility.  Per MD note, pt was started on Zoloft a few days ago and now with worsening aphasia/mental status.  Pt  found to have increased creatinine and to be encephalopathic.  Per family report to MD, pt has been spitting up secretions and this is a new occurence.  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  CXR 12/06/2018 negative, CXR 12/07/2018 negative, Low lung volumes.  Electroencephalogram completed today was abnormal secondary to increased left hemispheric amplitude and intermittent left frontal sharp transients.  Patient with a history of left craniotomy per neurologist note.  RN came during SLP eval to test pt for flur.  Swallow eval ordered.   Type of Study: Bedside Swallow Evaluation Previous Swallow Assessment: none in system Diet Prior to this Study: NPO Temperature Spikes Noted: Yes(100.6) Respiratory Status: Room air History of Recent Intubation: No Behavior/Cognition: Alert;Distractible;Doesn't follow directions Oral Cavity Assessment: Other (comment);Excessive secretions(excessive yellow tinged secretions retained in oral cavity, expectorated per SlP instructions ) Oral Cavity - Dentition: Other (Comment);Missing dentition Self-Feeding Abilities: Needs assist Patient Positioning: Upright in bed Baseline Vocal Quality: Low vocal intensity Volitional Cough: Cognitively unable to elicit Volitional Swallow: Unable to elicit    Oral/Motor/Sensory Function Overall Oral Motor/Sensory Function: (pt did not follow directions fully for oral motor exam)   Ice Chips Ice chips: Within functional limits Presentation: Spoon   Thin Liquid Thin Liquid: Within functional limits Presentation: Straw    Nectar Thick Nectar Thick Liquid: Not tested   Honey Thick Honey Thick Liquid: Not tested   Puree Puree: Impaired Pharyngeal Phase Impairments: Cough - Delayed Other Comments: suspect prolonged oral transiting, delayed cough   Solid     Solid: Impaired Oral Phase Impairments: Other (comment) Oral Phase Functional Implications: Other  (comment) Pharyngeal Phase Impairments: Cough - Immediate Other Comments: suspect oral delay, immediate cough post-swallow concerning for aspiration/? pharyngeal retention?      Macario Golds 12/07/2018,4:44 PM  Luanna Salk, [facility] SLP Acute Rehab Services Pager [phone] Office [phone]

## 2018-12-07 NOTE — Procedures (Signed)
ELECTROENCEPHALOGRAM REPORT   Patient: Mariah Mann       Room #: ZO10 EEG No. ID: [id number] Age: 73 y.o.        Sex: female Referring Physician: Erlinda Hong Report Date:  12/07/2018        Interpreting Physician: Alexis Goodell  History: Mariah Mann is an 73 y.o. female with speech and mental status changes  Medications:  Lovenox, Desitin  Conditions of Recording:  This is a 21 channel routine scalp EEG performed with bipolar and monopolar montages arranged in accordance to the international 10/20 system of electrode placement. One channel was dedicated to EKG recording.  The patient is in the awake and drowsy states.  Description:  The background activity is asymmetric.  The waking background activity consists of a low voltage, symmetrical, fairly well organized, 9 Hz alpha activity, seen from the parieto-occipital and posterior temporal regions.  Low voltage fast activity, poorly organized, is seen anteriorly and is at times superimposed on more posterior regions.  A mixture of theta and alpha rhythms are seen from the central and temporal regions. The patient drowses with slowing to irregular, low voltage theta and beta activity.  This awake and drowsy activity is of lower voltage over the right hemisphere.  Embedded in the higher voltage activity over the left hemisphere is also noted frontal sharp transients intermittently with phase reversal at [facility].   Stage II sleep is not obtained. Hyperventilation and intermittent photic stimulation were not performed.  IMPRESSION: This is an abnormal electroencephalogram secondary to increased left hemispheric amplitude and intermittent left frontal sharp transients.  Patient with a history of left craniotomy.     Alexis Goodell, MD Neurology [phone] 12/07/2018, 12:41 PM

## 2018-12-07 NOTE — Progress Notes (Signed)
Patient is admitted early this morning due to please see HPI.  She is seen and examined, t max 100.8, occasional dry cough noted during encounter,  she has a aphasia, does attempt to follow commands, lung diminished at base, bilateral foot drop on heal protector. Will add on blood culture, procalcitonin, flu swab, aspiration precaution, speech eval, continue ivf.

## 2018-12-08 ENCOUNTER — Inpatient Hospital Stay (HOSPITAL_COMMUNITY): Payer: Medicare Other

## 2018-12-08 DIAGNOSIS — G9341 Metabolic encephalopathy: Secondary | ICD-10-CM | POA: Diagnosis not present

## 2018-12-08 DIAGNOSIS — R5381 Other malaise: Secondary | ICD-10-CM

## 2018-12-08 DIAGNOSIS — G8191 Hemiplegia, unspecified affecting right dominant side: Secondary | ICD-10-CM

## 2018-12-08 DIAGNOSIS — G9340 Encephalopathy, unspecified: Secondary | ICD-10-CM | POA: Diagnosis present

## 2018-12-08 DIAGNOSIS — F0150 Vascular dementia without behavioral disturbance: Secondary | ICD-10-CM

## 2018-12-08 DIAGNOSIS — R4701 Aphasia: Secondary | ICD-10-CM | POA: Diagnosis not present

## 2018-12-08 LAB — CBC WITH DIFFERENTIAL/PLATELET
Abs Immature Granulocytes: 0.05 10*3/uL (ref 0.00–0.07)
Basophils Absolute: 0 10*3/uL (ref 0.0–0.1)
Basophils Relative: 0 %
Eosinophils Absolute: 0.2 10*3/uL (ref 0.0–0.5)
Eosinophils Relative: 2 %
HCT: 39 % (ref 36.0–46.0)
Hemoglobin: 12.1 g/dL (ref 12.0–15.0)
Immature Granulocytes: 1 %
Lymphocytes Relative: 18 %
Lymphs Abs: 1.7 10*3/uL (ref 0.7–4.0)
MCH: 30.3 pg (ref 26.0–34.0)
MCHC: 31 g/dL (ref 30.0–36.0)
MCV: 97.5 fL (ref 80.0–100.0)
Monocytes Absolute: 0.6 10*3/uL (ref 0.1–1.0)
Monocytes Relative: 7 %
NEUTROS ABS: 7.3 10*3/uL (ref 1.7–7.7)
Neutrophils Relative %: 72 %
Platelets: 184 10*3/uL (ref 150–400)
RBC: 4 MIL/uL (ref 3.87–5.11)
RDW: 13.7 % (ref 11.5–15.5)
WBC: 9.9 10*3/uL (ref 4.0–10.5)
nRBC: 0 % (ref 0.0–0.2)

## 2018-12-08 LAB — BASIC METABOLIC PANEL
Anion gap: 10 (ref 5–15)
BUN: 11 mg/dL (ref 8–23)
CO2: 21 mmol/L — ABNORMAL LOW (ref 22–32)
Calcium: 8.7 mg/dL — ABNORMAL LOW (ref 8.9–10.3)
Chloride: 107 mmol/L (ref 98–111)
Creatinine, Ser: 1.07 mg/dL — ABNORMAL HIGH (ref 0.44–1.00)
GFR calc Af Amer: >60 mL/min (ref >60)
GFR calc non Af Amer: 52 mL/min — ABNORMAL LOW (ref >60)
Glucose, Bld: 142 mg/dL — ABNORMAL HIGH (ref 70–99)
Potassium: 4.3 mmol/L (ref 3.5–5.1)
SODIUM: 138 mmol/L (ref 135–145)

## 2018-12-08 LAB — URINE CULTURE: Culture: NO GROWTH

## 2018-12-08 LAB — MAGNESIUM: Magnesium: 1.6 mg/dL — ABNORMAL LOW (ref 1.7–2.4)

## 2018-12-08 LAB — PROCALCITONIN: Procalcitonin: 0.4 ng/mL

## 2018-12-08 MED ORDER — ACETAMINOPHEN ER 650 MG PO TBCR: 650.0000 mg | EXTENDED_RELEASE_TABLET | Freq: Four times a day (QID) | ORAL | Status: DC | PRN

## 2018-12-08 MED ORDER — DONEPEZIL HCL 10 MG PO TABS
10.0000 mg | ORAL_TABLET | Freq: Every day | ORAL | Status: DC
  Administered 2018-12-09: 10 mg via ORAL
  Filled 2018-12-08 (×2): qty 1

## 2018-12-08 MED ORDER — LOSARTAN POTASSIUM 50 MG PO TABS
25.0000 mg | ORAL_TABLET | Freq: Every day | ORAL | Status: DC
  Administered 2018-12-09: 25 mg via ORAL
  Filled 2018-12-08 (×2): qty 1

## 2018-12-08 MED ORDER — VITAMIN B-12 1000 MCG PO TABS
1000.0000 ug | ORAL_TABLET | Freq: Every day | ORAL | Status: DC
  Administered 2018-12-09: 1000 ug via ORAL
  Filled 2018-12-08 (×2): qty 1

## 2018-12-08 MED ORDER — MAGNESIUM SULFATE 2 GM/50ML IV SOLN
2.0000 g | Freq: Once | INTRAVENOUS | Status: AC
  Administered 2018-12-08: 2 g via INTRAVENOUS
  Filled 2018-12-08: qty 50

## 2018-12-08 MED ORDER — ALLOPURINOL 100 MG PO TABS
100.0000 mg | ORAL_TABLET | Freq: Every day | ORAL | Status: DC
  Administered 2018-12-09: 100 mg via ORAL
  Filled 2018-12-08 (×2): qty 1

## 2018-12-08 MED ORDER — GUAIFENESIN ER 600 MG PO TB12
600.0000 mg | ORAL_TABLET | Freq: Two times a day (BID) | ORAL | Status: DC
  Administered 2018-12-08 – 2018-12-09 (×2): 600 mg via ORAL
  Filled 2018-12-08 (×3): qty 1

## 2018-12-08 MED ORDER — SERTRALINE HCL 50 MG PO TABS
50.0000 mg | ORAL_TABLET | Freq: Every day | ORAL | Status: DC
  Administered 2018-12-08: 50 mg via ORAL
  Filled 2018-12-08: qty 1

## 2018-12-08 NOTE — Evaluation (Signed)
Physical Therapy Evaluation Patient Details Name: Mariah Mann MRN: [id number] DOB: [birth year] Today's Date: 12/08/2018   History of Present Illness  Mariah Mann is a 73 y.o. female with medical history significant of prior stroke, HTN, HLD, dementia.  Pt admitted with mental status changes.    Clinical Impression   Pt presents with PF/inversion contractures of bilateral feet, severe LE weakness R>L, and dependence with all bed mobility. Pt was total assist for duration of session, requiring up to 4 people for mobility and pericare. Per chart review, pt is at bedbound level of mobility at previous facility. Pt with no further acute PT needs. PT signing off at this time, re-consult if needed.     Follow Up Recommendations SNF    Equipment Recommendations  None recommended by PT    Recommendations for Other Services       Precautions / Restrictions Precautions Precautions: Fall Precaution Comments: dependent/total A Restrictions Weight Bearing Restrictions: No      Mobility  Bed Mobility Overal bed mobility: Needs Assistance Bed Mobility: Rolling Rolling: Total assist;+2 for physical assistance;+2 for safety/equipment         General bed mobility comments: Rolling +PT, OT, and NT. RN performed pericare for pt while pt rolled. Rolling performed bilaterally to change sheets. Pt with anxiety when rolling towards the R, holding onto the bedrail and presented with UE tremors.  Transfers Overall transfer level: (Not able to assess, pt required total assist +4 for rolling and pericare)               General transfer comment: did not perform  Ambulation/Gait                Stairs            Wheelchair Mobility    Modified Rankin (Stroke Patients Only)       Balance Overall balance assessment: (not assessed, unable to get to sitting position)                                           Pertinent Vitals/Pain Faces Pain Scale: Hurts even  more Pain Location: generalized, with rolling Pain Descriptors / Indicators: Grimacing;Moaning Pain Intervention(s): Limited activity within patient's tolerance;Repositioned;Monitored during session    Home Living Family/patient expects to be discharged to:: Skilled nursing facility                 Additional Comments: unsure of pt's equipment, pt admitted from [place] center    Prior Function Level of Independence: Needs assistance   Gait / Transfers Assistance Needed: Per chart review, pt is "bedbound". Pt unable to answer questions beyond yes/no, and often times pt just repeats statements said by PT/OT           Hand Dominance        Extremity/Trunk Assessment   Upper Extremity Assessment Upper Extremity Assessment: Defer to OT evaluation RUE Deficits / Details: Pt has a history of CVA.  OT did observe pt use RUE but if OT encouraged pt to use RUE or performed AAROM pt resisted RUE Coordination: decreased fine motor;decreased gross motor    Lower Extremity Assessment Lower Extremity Assessment: RLE deficits/detail;LLE deficits/detail;Difficult to assess due to impaired cognition RLE Deficits / Details: Pt with history of CVA and R hemiplegia per chart review. Pt with PF/inversion position of bilateral feet. No spontaneous movement of RLE noted, and  none present when encouraged/assisted by PT. PT performed PROM hip/knee flexion/extension bilaterally.  LLE Deficits / Details: PF/Inversion position of L foot. Pt with some AROM knee flexion and hip flexion in gravity-eliminated position, unable to assess further due to lack of command following.     Cervical / Trunk Assessment Cervical / Trunk Assessment: (unable to assess; bed level exam)  Communication   Communication: Expressive difficulties(Per pt's daughter per chart review, pt's aphasia is new onset )  Cognition Arousal/Alertness: Awake/alert Behavior During Therapy: Anxious;Restless Overall  Cognitive Status: No family/caregiver present to determine baseline cognitive functioning                                 General Comments: Pt with mirroring PT and OT questions/statements, able to say yes/no, very little one-step command following       General Comments      Exercises     Assessment/Plan    PT Assessment Patent does not need any further PT services  PT Problem List         PT Treatment Interventions      PT Goals (Current goals can be found in the Care Plan section)  Acute Rehab PT Goals Patient Stated Goal: pt not able to state PT Goal Formulation: Patient unable to participate in goal setting Time For Goal Achievement: 12/08/18 Potential to Achieve Goals: Fair    Frequency     Barriers to discharge        Co-evaluation PT/OT/SLP Co-Evaluation/Treatment: Yes Reason for Co-Treatment: For patient/therapist safety PT goals addressed during session: Mobility/safety with mobility OT goals addressed during session: ADL's and self-care       AM-PAC PT "6 Clicks" Mobility  Outcome Measure Help needed turning from your back to your side while in a flat bed without using bedrails?: Total Help needed moving from lying on your back to sitting on the side of a flat bed without using bedrails?: Total Help needed moving to and from a bed to a chair (including a wheelchair)?: Total Help needed standing up from a chair using your arms (e.g., wheelchair or bedside chair)?: Total Help needed to walk in hospital room?: Total Help needed climbing 3-5 steps with a railing? : Total 6 Click Score: 6    End of Session   Activity Tolerance: Patient limited by fatigue Patient left: in bed;with nursing/sitter in room;with call bell/phone within reach Nurse Communication: Mobility status      Time: 5176-1607 PT Time Calculation (min) (ACUTE ONLY): 20 min   Charges:   PT Evaluation $PT Eval Low Complexity: 1 Low         Julien Girt, PT Acute  Rehabilitation Services Pager [phone]  Office [phone]  Tauren Delbuono D Elonda Husky 12/08/2018, 1:56 PM

## 2018-12-08 NOTE — Clinical Social Work Note (Signed)
Clinical Social Work Assessment  Patient Details  Name: Mariah Mann MRN: [id number] Date of Birth: October 10, 1946  Date of referral:  12/08/18               Reason for consult:  Discharge Planning                Permission sought to share information with:    Permission granted to share information::  No(Patient only oriented to self)  Name::     Mariah Mann  Agency::  [facility]  Relationship::  Daughter  Contact Information:     Housing/Transportation Living arrangements for the past 2 months:  [place] of Information:  Adult Children Patient Interpreter Needed:  None Criminal Activity/Legal Involvement Pertinent to Current Situation/Hospitalization:    Significant Relationships:  Adult Children, Warehouse manager Lives with:  Facility Resident Do you feel safe going back to the place where you live?    Need for family participation in patient care:     Care giving concerns:  No care giving conerns at the time of assessment.   Social Worker assessment / plan:  LCSW consulted for dc planning.  Patient is a LTC resident at [facility]. Patient is only oriented to self. LCSW spoke with patients daughter Mariah Mann by phone.   According to Mariah Mann patient has been at [facility] for about 3 years. Patient is not ambulatory and needs assistance with ADLs. Patient is able to feed herself.   PLAN: Return to [facility].   Employment status:  Retired Nurse, adult PT Recommendations:  Not assessed at this time Information / Referral to community resources:     Patient/Family's Response to care:  Mariah Mann is thankful for LCSW coordination with dc planning.   Patient/Family's Understanding of and Emotional Response to Diagnosis, Current Treatment, and Prognosis:  Daughter is realistic about pt current conditions. Mariah Mann expressed that she wishes her mother didn't have to be in a facility, but she is the only child and cannot  do it alone.   Emotional Assessment Appearance:  Appears stated age Attitude/Demeanor/Rapport:  Unable to Assess Affect (typically observed):  Unable to Assess(Patient only oriented to self) Orientation:  Oriented to Self Alcohol / Substance use:  Not Applicable Psych involvement (Current and /or in the community):  No (Comment)  Discharge Needs  Concerns to be addressed:  No discharge needs identified Readmission within the last 30 days:  No Current discharge risk:  None Barriers to Discharge:  Continued Medical Work up   [employer], LCSW 12/08/2018, 11:44 AM

## 2018-12-08 NOTE — Progress Notes (Signed)
MBS completed, no aspiration observed.  Pt does demonstrate delayed oral transiting/prolonged hold required total cues with delay to expectorate.  Recommend advance to dys3/thin with strict precaution.  NT/sitter present and informed to have emesis bag to allow oral clearance if holding.   Luanna Salk, [facility] SLP Acute Rehab Services Pager [phone] Office [phone]

## 2018-12-08 NOTE — Evaluation (Signed)
Occupational Therapy Evaluation Patient Details Name: Mariah Mann MRN: [id number] DOB: 08/30/1946 Today's Date: 12/08/2018    History of Present Illness Mariah Mann is a 73 y.o. female with medical history significant of prior stroke, HTN, HLD, dementia.  Pt admitted with mental status changes.     Clinical Impression   Pt admitted with mental status changes from SNF.  Pt currently with functional limitations due to the deficits listed below (see OT Problem List).  Pt will benefit from skilled OT to increase their safety and independence with ADL and functional mobility for ADL to facilitate discharge to venue listed below.      Follow Up Recommendations  SNF   Would be beneficial to perform education with pts daughter regarding positioning and simple self care task ( such as grooming)   Equipment Recommendations  None recommended by OT       Precautions / Restrictions Precautions Precautions: Fall Precaution Comments: dependent/total A      Mobility Bed Mobility Overal bed mobility: Needs Assistance Bed Mobility: Rolling Rolling: Total assist;+2 for physical assistance;+2 for safety/equipment            Transfers                 General transfer comment: did not perform        ADL either performed or assessed with clinical judgement   ADL Overall ADL's : Needs assistance/impaired     Grooming: Maximal assistance;Standing                       Toileting- Clothing Manipulation and Hygiene: +2 for safety/equipment;Bed level;+2 for physical assistance;Total assistance Toileting - Clothing Manipulation Details (indicate cue type and reason): performed hygiene in bed with PT, OT, RN and CNA.  Pt total A plus 2       General ADL Comments: Pt dependent with ADL activity.  Pt seemed fearful of movement and resisted OT with AAROM of RUE.      Vision Patient Visual Report: No change from baseline              Pertinent Vitals/Pain Pain  Assessment: Faces Faces Pain Scale: Hurts even more Pain Location: pt did not localize- but was grimacing and seemed fearful of movement in bed Pain Descriptors / Indicators: Grimacing Pain Intervention(s): Limited activity within patient's tolerance;Repositioned;Monitored during session     Hand Dominance     Extremity/Trunk Assessment Upper Extremity Assessment Upper Extremity Assessment: RUE deficits/detail RUE Deficits / Details: Pt has a history of CVA.  OT did observe pt use RUE but if OT encouraged pt to use RUE or performed AAROM pt resisted RUE Coordination: decreased fine motor;decreased gross motor           Communication  Pt said Ok may times during tx session - but did not follow commands. Pt needed hand over hand A for all activity   Cognition Arousal/Alertness: Awake/alert Behavior During Therapy: Anxious;Flat affect;Restless Overall Cognitive Status: No family/caregiver present to determine baseline cognitive functioning                                                Home Living Family/patient expects to be discharged to:: Skilled nursing facility  OT Problem List: Decreased strength;Decreased activity tolerance;Impaired balance (sitting and/or standing);Decreased knowledge of precautions;Decreased range of motion;Impaired UE functional use;Pain;Obesity      OT Treatment/Interventions: Self-care/ADL training;Patient/family education;Therapeutic activities;Other (comment);Therapeutic exercise(positoining)    OT Goals(Current goals can be found in the care plan section) Acute Rehab OT Goals Patient Stated Goal: pt not able to state OT Goal Formulation: With patient Time For Goal Achievement: 12/22/18 Potential to Achieve Goals: Fair ADL Goals Pt Will Perform Grooming: with mod assist;bed level  OT Frequency: Min 2X/week              AM-PAC OT "6 Clicks" Daily  Activity     Outcome Measure Help from another person eating meals?: Total Help from another person taking care of personal grooming?: Total Help from another person toileting, which includes using toliet, bedpan, or urinal?: Total Help from another person bathing (including washing, rinsing, drying)?: Total Help from another person to put on and taking off regular upper body clothing?: Total Help from another person to put on and taking off regular lower body clothing?: Total 6 Click Score: 6   End of Session Nurse Communication: Mobility status  Activity Tolerance: Other (comment)(limited by anxiety ) Patient left: in bed;with nursing/sitter in room  OT Visit Diagnosis: Muscle weakness (generalized) (M62.81);Hemiplegia and hemiparesis;Other abnormalities of gait and mobility (R26.89) Hemiplegia - Right/Left: Right Hemiplegia - dominant/non-dominant: Dominant                Time: 0929-5747 OT Time Calculation (min): 20 min Charges:  OT Evaluation $OT Eval Moderate Complexity: 1 Mod  Kari Baars, OT Acute Rehabilitation Services [phone] Office- [place], Edwena Felty D 12/08/2018, 1:20 PM

## 2018-12-08 NOTE — Care Management Obs Status (Signed)
[place] NOTIFICATION   Patient Details  Name: Mariah Mann MRN: [id number] Date of Birth: 26-Jan-1946   Medicare Observation Status Notification Given:  Yes    Leeroy Cha, RN 12/08/2018, 2:05 PM

## 2018-12-08 NOTE — Progress Notes (Signed)
  Speech Language Pathology Treatment: Dysphagia  Patient Details Name: Mariah Mann MRN: [id number] DOB: [birth year] Today's Date: 12/08/2018 Time: 2174-7159 SLP Time Calculation (min) (ACUTE ONLY): 14 min  Assessment / Plan / Recommendation Clinical Impression  SLP visit to determine indication and appropriateness for MBS.   Pt sleepy as she did not sleep well last nigjht. She did however wake adequately to consume po intake.  Pt appeared with swift swallow initially without oral residuals, however after intake of pudding - she presents with weak cough. Given h/o CVA and flu swab  Negative, will proceed with MBS to assure pt is not aspirating.   Pt, sitter, xray and RN informed.  j   HPI HPI: Mariah Mann is a 73 yo female with worsening aphasia x2 days and possible suicidal ideation a few days ago at facility.  Per MD note, pt was started on Zoloft a few days ago and now with worsening aphasia/mental status.  Pt found to have increased creatinine and to be encephalopathic.  Per family report to MD, pt has been spitting up secretions and this is a new occurence.  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  CXR 12/06/2018 negative, CXR 12/07/2018 negative, Low lung volumes.  Electroencephalogram completed today was abnormal secondary to increased left hemispheric amplitude and intermittent left frontal sharp transients.  Patient with a history of left craniotomy per neurologist note.  RN came during SLP eval to test pt for flur.  Swallow eval ordered.        SLP Plan  MBS       Recommendations  Supervision: Full supervision/cueing for compensatory strategies Compensations: Slow rate;Small sips/bites Postural Changes and/or Swallow Maneuvers: Seated upright 90 degrees;Upright 30-60 min after meal                Oral Care Recommendations: Oral care BID Follow up Recommendations: (tbd) Plan: MBS       GO                 Macario Golds 12/08/2018, 8:14 AM  Luanna Salk, [place] SLP Acute Rehab Services Pager [phone] Office [phone]

## 2018-12-08 NOTE — NC FL2 (Signed)
[place] LEVEL OF CARE SCREENING TOOL     IDENTIFICATION  Patient Name: Michol Emory Birthdate: Aug 16, 1946 Sex: female Admission Date (Current Location): 12/06/2018  [facility] and [place] Number:  Herbalist and Address:  [facility],  [place] [address] [place]      Provider Number: [id number]  Attending Physician Name and Address:  Florencia Reasons, MD  Relative Name and Phone Number:       Current Level of Care: Hospital Recommended Level of Care: [place] Prior Approval Number:    Date Approved/Denied:   PASRR Number:    Discharge Plan: SNF    Current Diagnoses: Patient Active Problem List   Diagnosis Date Noted  . Acute encephalopathy 12/07/2018  . HTN (hypertension) 12/07/2018  . Debility 12/07/2018  . Depression 12/07/2018    Orientation RESPIRATION BLADDER Height & Weight     Self  Normal Incontinent Weight:   Height:     BEHAVIORAL SYMPTOMS/MOOD NEUROLOGICAL BOWEL NUTRITION STATUS      Incontinent Diet(See dc summary)  AMBULATORY STATUS COMMUNICATION OF NEEDS Skin   Total Care   Normal                       Personal Care Assistance Level of Assistance  Bathing, Feeding, Dressing Bathing Assistance: Maximum assistance Feeding assistance: Limited assistance Dressing Assistance: Maximum assistance     Functional Limitations Info  Sight, Hearing, Speech Sight Info: Adequate Hearing Info: Adequate Speech Info: Impaired    SPECIAL CARE FACTORS FREQUENCY                       Contractures Contractures Info: Not present    Additional Factors Info  Code Status, Allergies Code Status Info: Full Allergies Info: Contrast Media Iodinated Diagnostic Agents, Iodine, Keflex Cephalexin, Macrodantin Nitrofurantoin, Penicillins           Current Medications (12/08/2018):  This is the current hospital active medication list Current Facility-Administered Medications  Medication Dose Route  Frequency Provider Last Rate Last Dose  . 0.9 %  sodium chloride infusion   Intravenous Continuous Etta Quill, DO 100 mL/hr at 12/07/18 2332    . acetaminophen (TYLENOL) tablet 650 mg  650 mg Oral Q6H PRN Etta Quill, DO       Or  . acetaminophen (TYLENOL) suppository 650 mg  650 mg Rectal Q6H PRN Etta Quill, DO   650 mg at 12/07/18 0704  . enoxaparin (LOVENOX) injection 40 mg  40 mg Subcutaneous Q24H Jennette Kettle M, DO   40 mg at 12/08/18 3500  . guaiFENesin-dextromethorphan (ROBITUSSIN DM) 100-10 MG/5ML syrup 5 mL  5 mL Oral Q4H PRN Florencia Reasons, MD   5 mL at 12/08/18 1135  . liver oil-zinc oxide (DESITIN) 40 % ointment   Topical QID Florencia Reasons, MD      . magnesium sulfate IVPB 2 g 50 mL  2 g Intravenous Once Florencia Reasons, MD 50 mL/hr at 12/08/18 1056 2 g at 12/08/18 1056  . ondansetron (ZOFRAN) tablet 4 mg  4 mg Oral Q6H PRN Etta Quill, DO       Or  . ondansetron [facility]) injection 4 mg  4 mg Intravenous Q6H PRN Etta Quill, DO         Discharge Medications: Please see discharge summary for a list of discharge medications.  Relevant Imaging Results:  Relevant Lab Results:   Additional Information ssn: [id number]  Servando Snare, LCSW

## 2018-12-08 NOTE — Progress Notes (Signed)
PROGRESS NOTE  Mariah Mann LPF:[id number] DOB: 1946-08-16 DOA: 12/06/2018 PCP: Garwin Brothers, MD  HPI/Recap of past 24 hours:  Continue to have intermittent dry cough, no hypoxia  Assessment/Plan: Principal Problem:   Acute encephalopathy Active Problems:   HTN (hypertension)   Debility   Depression   Encephalopathy acute   Acute metabolic encephalopathy with baseline vascular dementia -MRI brain on presentation "Chronic ischemic microangiopathy and severe volume loss without acute abnormality." -EEG "This is an abnormal electroencephalogram secondary to increased left hemispheric amplitude and intermittent left frontal sharp transients.  Patient with a history of left craniotomy.  " -per daughter patient is more lethargic, less talkative  -she is dehydrated on presentation, she appear more awake with hydration  Leukocytosis Wbc 12.8 on presentation Normalized with hydration  Fever She presents with fever 100.8 Blood culture/urine culture negative, mrsa screening negative, flu swab negtive She does has dry cough , per daughter this is new cxr on presentation "Low volume chest without acute finding ", procalcitonin 0.4, no wheezing, no rhonchi on exam She has swallow eval: result as below  "SLP Diet Recommendations: Dysphagia 3 (Mech soft) solids;Thin liquid   Liquid Administration via: Straw;Cup   Medication Administration: Whole meds with puree(crush if orally holding, ? liquid suspension?)   Supervision: Patient able to self feed   Compensations: Slow rate;Small sips/bites;Other (Comment);Minimize environmental distractions(clean mouth after meals, pt must expectorate if orally holding extensively)   Postural Changes: Seated upright at 90 degrees;Remain semi-upright after after feeds/meals (Comment)"  Hypomagnesemia: Replace mag  HTN: she is on losartan   H/o CVA with aphasia, right sided hemiplegia -I donot see asa or statin on home meds list, daughter is  now sure about these meds, daughter reports patient has not seen a neurologist since her last cva  Vascular dementia: She is on aricept  FTT: per daughter , patient has been bed to wheelchair bound, with bowel and bladder incontinence for years. She has been at [facility] for several years.  Per nursing home, patient has suicidal ideation Now that she is more awake, will consult psychiatry Sitter in room   Code Status: full, verified with daughter over the phone  Family Communication: patient , daughter over the phone  Disposition Plan: return to SNF, hopefully on 1/10   Consultants:  psychiatry  Procedures:  MBS   Antibiotics:  none   Objective: BP (!) 162/76 (BP Location: Left Arm) Comment: RN Notified  Pulse 96   Temp 98.7 F (37.1 C) (Oral)   Resp 16   SpO2 98%   Intake/Output Summary (Last 24 hours) at 12/08/2018 1337 Last data filed at 12/08/2018 1205 Gross per 24 hour  Intake 2525.78 ml  Output 350 ml  Net 2175.78 ml   There were no vitals filed for this visit.  Exam: Patient is examined daily including today on 12/08/2018, exams remain the same as of yesterday except that has changed    General:  Demented elderly, does not appear in distress, she does not talk, does not follow commands  Cardiovascular: RRR  Respiratory: diminished at basis, no wheezing, no rales, no rhonchi   Abdomen: Soft/ND/NT, positive BS  Musculoskeletal: bilateral foot drop  Neuro: alert, does not talk  Data Reviewed: Basic Metabolic Panel: Recent Labs  Lab 12/06/18 1823 12/07/18 0555 12/08/18 0617  NA 138 138 138  K 4.5 4.3 4.3  CL 103 107 107  CO2 24 23 21*  GLUCOSE 153* 145* 142*  BUN 15 15 11   CREATININE 1.35* 1.17*  1.07*  CALCIUM 10.1 9.0 8.7*  MG  --   --  1.6*   Liver Function Tests: Recent Labs  Lab 12/06/18 1823 12/07/18 0555  AST 27 26  ALT 29 25  ALKPHOS 78 65  BILITOT 0.9 0.7  PROT 9.0* 7.6  ALBUMIN 4.0 3.4*   Recent Labs  Lab 12/06/18 1823   LIPASE 42   Recent Labs  Lab 12/06/18 1823  AMMONIA 17   CBC: Recent Labs  Lab 12/06/18 1823 12/07/18 0555 12/08/18 0617  WBC 12.8* 12.3* 9.9  NEUTROABS 9.9*  --  7.3  HGB 14.6 12.2 12.1  HCT 46.4* 38.3 39.0  MCV 98.7 98.5 97.5  PLT 190 178 184   Cardiac Enzymes:   No results for input(s): CKTOTAL, CKMB, CKMBINDEX, TROPONINI in the last 168 hours. BNP (last 3 results) No results for input(s): BNP in the last 8760 hours.  ProBNP (last 3 results) No results for input(s): PROBNP in the last 8760 hours.  CBG: Recent Labs  Lab 12/06/18 1855  GLUCAP 135*    Recent Results (from the past 240 hour(s))  Urine culture     Status: None   Collection Time: 12/06/18  5:40 PM  Result Value Ref Range Status   Specimen Description   Final    URINE, RANDOM Performed at [facility] [address], [place], [place] [address]    Special Requests   Final    NONE Performed at [facility], [place] [address] [place], [place] [address]    Culture   Final    NO GROWTH Performed at [facility], [place] [address], [place], [place] [address]    Report Status 12/08/2018 FINAL  Final  Culture, blood (routine x 2)     Status: None (Preliminary result)   Collection Time: 12/07/18  9:07 AM  Result Value Ref Range Status   Specimen Description   Final    BLOOD LEFT HAND Performed at [facility] [address] [place], [place] [address]    Special Requests   Final    BOTTLES DRAWN AEROBIC ONLY Blood Culture adequate volume Performed at [facility] [address], [place], [place] [address]    Culture   Final    NO GROWTH < 24 HOURS Performed at [facility] [address] [place], [place] [address]    Report Status PENDING  Incomplete  Culture, blood (routine x 2)     Status: None (Preliminary result)   Collection Time: 12/07/18  9:12 AM  Result Value Ref Range Status   Specimen  Description   Final    BLOOD LEFT HAND Performed at [facility] [address], [place], [place] [address]    Special Requests   Final    BOTTLES DRAWN AEROBIC ONLY Blood Culture adequate volume Performed at [facility] [address], [place], [place] [address]    Culture   Final    NO GROWTH < 24 HOURS Performed at [facility] [address] [place], [place] [address]    Report Status PENDING  Incomplete  MRSA PCR Screening     Status: None   Collection Time: 12/07/18  1:40 PM  Result Value Ref Range Status   MRSA by PCR NEGATIVE NEGATIVE Final    Comment:        The GeneXpert MRSA Assay (FDA approved for NASAL specimens only), is one component of a comprehensive  MRSA colonization surveillance program. It is not intended to diagnose MRSA infection nor to guide or monitor treatment for MRSA infections. Performed at [facility], [place] [address] [place], [place] [address]      Studies: No results found.  Scheduled Meds: . enoxaparin (LOVENOX) injection  40 mg Subcutaneous Q24H  . liver oil-zinc oxide   Topical QID    Continuous Infusions: . sodium chloride 100 mL/hr at 12/08/18 1150     Time spent: 63mins I have personally reviewed and interpreted on  12/08/2018 daily labs, imagings as discussed above under date review session and assessment and plans.  I reviewed all nursing notes, pharmacy notes,  vitals, pertinent old records  I have discussed plan of care as described above with RN , patient and family on 12/08/2018   Florencia Reasons MD, PhD  [facility] Pager [phone]. If 7PM-7AM, please contact night-coverage at [email or web address], password [facility] 12/08/2018, 1:37 PM  LOS: 1 day

## 2018-12-08 NOTE — Care Management CC44 (Signed)
Condition Code 44 Documentation Completed  Patient Details  Name: Mariah Mann MRN: [id number] Date of Birth: 1946/05/16   Condition Code 44 given:  Yes Patient signature on Condition Code 44 notice:  Yes Documentation of 2 MD's agreement:  Yes Code 44 added to claim:  Yes    Leeroy Cha, RN 12/08/2018, 2:05 PM

## 2018-12-08 NOTE — Progress Notes (Signed)
Modified Barium Swallow Progress Note  Patient Details  Name: Mariah Mann MRN: [id number] Date of Birth: [birth year]  Today's Date: 12/08/2018  Modified Barium Swallow completed.  Full report located under Chart Review in the Imaging Section.  Brief recommendations include the following:  Clinical Impression  Pt presents with mild oral dysphagia due to prior cva impacting motor planning thus resulting in delayed oral transiting, lingual pumping and premature spillage of liquids.  She did have oral residuals with pudding/cracker and mostly cleared with delayed pt initiated swallow.  Pt did NOT swallow barium tablet with thin =  nor added thin or pudding despite max cues and dry spoon, verbal cueing attempts.  She finally expectorated pudding/thin and tablet after several minutes.  Pharyngeal swallow is strong and timely.  Recommend dys3/thin diet with strict aspiration precautions.  If pt is orally holding boluses = she must expectorate them.  Unfortunately pt has purewick in place and thus no oral suction availability.  Medication with puree recommended and have pt feed herself to improve neurological input.     Swallow Evaluation Recommendations       SLP Diet Recommendations: Dysphagia 3 (Mech soft) solids;Thin liquid   Liquid Administration via: Straw;Cup   Medication Administration: Whole meds with puree(crush if orally holding, ? liquid suspension?)   Supervision: Patient able to self feed   Compensations: Slow rate;Small sips/bites;Other (Comment);Minimize environmental distractions(clean mouth after meals, pt must expectorate if orally holding extensively)   Postural Changes: Seated upright at 90 degrees;Remain semi-upright after after feeds/meals (Comment)   Oral Care Recommendations: Other (Comment)(oral care after meals)       Luanna Salk, [place] [facility] SLP Acute Rehab Services Pager [phone] Office [phone]  Macario Golds 12/08/2018,9:56 AM

## 2018-12-09 ENCOUNTER — Observation Stay (HOSPITAL_COMMUNITY): Payer: Medicare Other

## 2018-12-09 DIAGNOSIS — Z008 Encounter for other general examination: Secondary | ICD-10-CM | POA: Diagnosis not present

## 2018-12-09 DIAGNOSIS — R4701 Aphasia: Secondary | ICD-10-CM | POA: Diagnosis not present

## 2018-12-09 DIAGNOSIS — R5381 Other malaise: Secondary | ICD-10-CM | POA: Diagnosis not present

## 2018-12-09 DIAGNOSIS — C3411 Malignant neoplasm of upper lobe, right bronchus or lung: Secondary | ICD-10-CM

## 2018-12-09 DIAGNOSIS — I10 Essential (primary) hypertension: Secondary | ICD-10-CM

## 2018-12-09 DIAGNOSIS — G9340 Encephalopathy, unspecified: Secondary | ICD-10-CM | POA: Diagnosis not present

## 2018-12-09 DIAGNOSIS — R4182 Altered mental status, unspecified: Secondary | ICD-10-CM | POA: Diagnosis not present

## 2018-12-09 DIAGNOSIS — G9341 Metabolic encephalopathy: Secondary | ICD-10-CM | POA: Diagnosis not present

## 2018-12-09 LAB — LIPID PANEL
Cholesterol: 140 mg/dL (ref 0–200)
HDL: 34 mg/dL — ABNORMAL LOW (ref >40)
LDL Cholesterol: 94 mg/dL (ref 0–99)
Total CHOL/HDL Ratio: 4.1 RATIO
Triglycerides: 58 mg/dL (ref <150)
VLDL: 12 mg/dL (ref 0–40)

## 2018-12-09 LAB — BASIC METABOLIC PANEL
Anion gap: 10 (ref 5–15)
BUN: 11 mg/dL (ref 8–23)
CHLORIDE: 106 mmol/L (ref 98–111)
CO2: 21 mmol/L — ABNORMAL LOW (ref 22–32)
Calcium: 9.1 mg/dL (ref 8.9–10.3)
Creatinine, Ser: 1.08 mg/dL — ABNORMAL HIGH (ref 0.44–1.00)
GFR calc Af Amer: 60 mL/min — ABNORMAL LOW (ref >60)
GFR calc non Af Amer: 52 mL/min — ABNORMAL LOW (ref >60)
Glucose, Bld: 162 mg/dL — ABNORMAL HIGH (ref 70–99)
Potassium: 4.4 mmol/L (ref 3.5–5.1)
SODIUM: 137 mmol/L (ref 135–145)

## 2018-12-09 LAB — HEMOGLOBIN A1C
Hgb A1c MFr Bld: 7.2 % — ABNORMAL HIGH (ref 4.8–5.6)
Mean Plasma Glucose: 159.94 mg/dL

## 2018-12-09 LAB — MAGNESIUM: Magnesium: 2 mg/dL (ref 1.7–2.4)

## 2018-12-09 LAB — URIC ACID: Uric Acid, Serum: 5.7 mg/dL (ref 2.5–7.1)

## 2018-12-09 LAB — PROCALCITONIN: Procalcitonin: 0.38 ng/mL

## 2018-12-09 MED ORDER — MAGNESIUM GLUCONATE 30 MG PO TABS: 30.0000 mg | ORAL_TABLET | ORAL | 0 refills | Status: AC

## 2018-12-09 MED ORDER — ATORVASTATIN CALCIUM 10 MG PO TABS: 10.0000 mg | ORAL_TABLET | Freq: Every day | ORAL | 0 refills | Status: AC

## 2018-12-09 MED ORDER — GUAIFENESIN ER 600 MG PO TB12: 600.0000 mg | ORAL_TABLET | Freq: Two times a day (BID) | ORAL | 0 refills | Status: AC

## 2018-12-09 MED ORDER — METOPROLOL TARTRATE 12.5 MG HALF TABLET
12.5000 mg | ORAL_TABLET | Freq: Two times a day (BID) | ORAL | Status: DC
  Administered 2018-12-09: 12.5 mg via ORAL
  Filled 2018-12-09: qty 1

## 2018-12-09 MED ORDER — METOPROLOL TARTRATE 25 MG PO TABS: 12.5000 mg | ORAL_TABLET | Freq: Two times a day (BID) | ORAL | 0 refills | Status: AC

## 2018-12-09 MED ORDER — VITAMIN B-1 100 MG PO TABS: 100.0000 mg | ORAL_TABLET | Freq: Every day | ORAL | 0 refills | Status: AC

## 2018-12-09 MED ORDER — METFORMIN HCL 500 MG PO TABS: 500.0000 mg | ORAL_TABLET | Freq: Two times a day (BID) | ORAL | 0 refills | Status: AC

## 2018-12-09 NOTE — Progress Notes (Signed)
PROGRESS NOTE  Mariah Mann VPX:[id number] DOB: Oct 22, 1946 DOA: 12/06/2018 PCP: Garwin Brothers, MD  HPI/Recap of past 24 hours:  Continue to have intermittent dry cough, no hypoxia Sitter at bedside  Assessment/Plan: Principal Problem:   Acute encephalopathy Active Problems:   HTN (hypertension)   Debility   Depression   Encephalopathy acute   Aphasia   Vascular dementia without behavioral disturbance (HCC)   Right hemiplegia (HCC)   Hypomagnesemia   Acute metabolic encephalopathy with baseline vascular dementia -MRI brain on presentation "Chronic ischemic microangiopathy and severe volume loss without acute abnormality." -EEG "This is an abnormal electroencephalogram secondary to increased left hemispheric amplitude and intermittent left frontal sharp transients.  Patient with a history of left craniotomy.  " -per daughter patient is more lethargic, less talkative  -she is dehydrated on presentation, she appear more awake with hydration  Leukocytosis Wbc 12.8 on presentation Normalized with hydration  Fever She presents with fever 100.8 Blood culture/urine culture negative, mrsa screening negative, flu swab negtive She does has dry cough , per daughter this is new cxr on presentation "Low volume chest without acute finding ", procalcitonin 0.4, no wheezing, no rhonchi on exam She has swallow eval: result as below  "SLP Diet Recommendations: Dysphagia 3 (Mech soft) solids;Thin liquid   Liquid Administration via: Straw;Cup   Medication Administration: Whole meds with puree(crush if orally holding, ? liquid suspension?)   Supervision: Patient able to self feed   Compensations: Slow rate;Small sips/bites;Other (Comment);Minimize environmental distractions(clean mouth after meals, pt must expectorate if orally holding extensively)   Postural Changes: Seated upright at 90 degrees;Remain semi-upright after after feeds/meals (Comment)"  Due to persistent cough a Ct chest  obtained, it did not review infection, does show atelectasis and 1.5cm right upper lobe lung nodule with enlarged right hilar lymph node. I have discussed with daughter over the phone, advised to follow up with oncology regarding further options, she agrees with the plan  Daughter reports patient had lung cancer with possible brain mets 38yrs ago, patient underwent surgery ( resection of the lung cancer), patient also underwent craniotomy but no brain mets found at the time.  Hypomagnesemia: Replaced and normalized  HTN: she is on losartan   H/o CVA with aphasia, right sided hemiplegia -I donot see asa or statin on home meds list, daughter is now sure about these meds, daughter reports patient has not seen a neurologist since her last cva  Vascular dementia: She is on aricept  FTT: per daughter , patient has been bed to wheelchair bound, with bowel and bladder incontinence for years. She has been at [facility] for several years.  Per nursing home, patient has suicidal ideation Case discussed with  psychiatry Sitter in room   Code Status: full, verified with daughter over the phone  Family Communication: patient , daughter over the phone  Disposition Plan: return to SNF after psychiatry eval   Consultants:  psychiatry  Procedures:  MBS   Antibiotics:  none   Objective: BP (!) 155/89 (BP Location: Right Arm)   Pulse 86   Temp 99.1 F (37.3 C) (Oral)   Resp 19   SpO2 98%   Intake/Output Summary (Last 24 hours) at 12/09/2018 1112 Last data filed at 12/09/2018 0534 Gross per 24 hour  Intake 1710.11 ml  Output 1200 ml  Net 510.11 ml   There were no vitals filed for this visit.  Exam: Patient is examined daily including today on 12/09/2018, exams remain the same as of yesterday except that  has changed    General:  Demented elderly, does not appear in distress, she does not talk, does not follow commands  Cardiovascular: RRR  Respiratory: diminished at basis, no  wheezing, no rales, no rhonchi   Abdomen: Soft/ND/NT, positive BS  Musculoskeletal: bilateral foot drop  Neuro: alert, does not talk  Data Reviewed: Basic Metabolic Panel: Recent Labs  Lab 12/06/18 1823 12/07/18 0555 12/08/18 0617 12/09/18 0730  NA 138 138 138 137  K 4.5 4.3 4.3 4.4  CL 103 107 107 106  CO2 24 23 21* 21*  GLUCOSE 153* 145* 142* 162*  BUN 15 15 11 11   CREATININE 1.35* 1.17* 1.07* 1.08*  CALCIUM 10.1 9.0 8.7* 9.1  MG  --   --  1.6* 2.0   Liver Function Tests: Recent Labs  Lab 12/06/18 1823 12/07/18 0555  AST 27 26  ALT 29 25  ALKPHOS 78 65  BILITOT 0.9 0.7  PROT 9.0* 7.6  ALBUMIN 4.0 3.4*   Recent Labs  Lab 12/06/18 1823  LIPASE 42   Recent Labs  Lab 12/06/18 1823  AMMONIA 17   CBC: Recent Labs  Lab 12/06/18 1823 12/07/18 0555 12/08/18 0617  WBC 12.8* 12.3* 9.9  NEUTROABS 9.9*  --  7.3  HGB 14.6 12.2 12.1  HCT 46.4* 38.3 39.0  MCV 98.7 98.5 97.5  PLT 190 178 184   Cardiac Enzymes:   No results for input(s): CKTOTAL, CKMB, CKMBINDEX, TROPONINI in the last 168 hours. BNP (last 3 results) No results for input(s): BNP in the last 8760 hours.  ProBNP (last 3 results) No results for input(s): PROBNP in the last 8760 hours.  CBG: Recent Labs  Lab 12/06/18 1855  GLUCAP 135*    Recent Results (from the past 240 hour(s))  Urine culture     Status: None   Collection Time: 12/06/18  5:40 PM  Result Value Ref Range Status   Specimen Description   Final    URINE, RANDOM Performed at [facility] [address], [place], [place] [address]    Special Requests   Final    NONE Performed at [facility], [place] [address] [place], [place] [address]    Culture   Final    NO GROWTH Performed at [facility], [place] [address], [place], [place] [address]    Report Status 12/08/2018 FINAL  Final  Culture, blood (routine x 2)     Status: None (Preliminary result)   Collection Time: 12/07/18   9:07 AM  Result Value Ref Range Status   Specimen Description   Final    BLOOD LEFT HAND Performed at [facility] [address] [place], [place] [address]    Special Requests   Final    BOTTLES DRAWN AEROBIC ONLY Blood Culture adequate volume Performed at [facility] [address], [place], [place] [address]    Culture   Final    NO GROWTH 2 DAYS Performed at [facility] [address], [place], [place] [address]    Report Status PENDING  Incomplete  Culture, blood (routine x 2)     Status: None (Preliminary result)   Collection Time: 12/07/18  9:12 AM  Result Value Ref Range Status   Specimen Description   Final    BLOOD LEFT HAND Performed at [facility] [address], [place], [place] [address]    Special Requests   Final    BOTTLES DRAWN AEROBIC  ONLY Blood Culture adequate volume Performed at [facility] [address] [place], [place] [address]    Culture   Final    NO GROWTH 2 DAYS Performed at [facility] [address], [place], [place] [address]    Report Status PENDING  Incomplete  MRSA PCR Screening     Status: None   Collection Time: 12/07/18  1:40 PM  Result Value Ref Range Status   MRSA by PCR NEGATIVE NEGATIVE Final    Comment:        The GeneXpert MRSA Assay (FDA approved for NASAL specimens only), is one component of a comprehensive MRSA colonization surveillance program. It is not intended to diagnose MRSA infection nor to guide or monitor treatment for MRSA infections. Performed at [facility], [place] [address], [place], [place] [address]      Studies: Ct Chest Wo Contrast  Result Date: 12/09/2018 CLINICAL DATA:  73 year old with persistent cough. EXAM: CT CHEST WITHOUT CONTRAST TECHNIQUE: Multidetector CT imaging of the chest was performed following the standard protocol without IV contrast. COMPARISON:  Chest radiograph  12/07/2018 FINDINGS: Cardiovascular: Atherosclerotic calcifications in the thoracic aorta without aneurysm. Heart size is normal without significant pericardial fluid. Mediastinum/Nodes: Calcifications in the mediastinum compatible with old granulomatous disease. Concern for a low-density enlarged lymph node in the right hilar region on sequence 3, image 66 measuring up to 1.8 cm. Difficult to exclude small left hilar nodes on this noncontrast examination. No significant axillary lymph node enlargement. No gross abnormality to the thyroid tissue. Small right supraclavicular lymph nodes. Lungs/Pleura: There is a lobulated nodule in the right upper lung on sequence 8, image 34 that measures 1.2 x 1.5 x 1.3 cm. This is concerning for a neoplasm. Postsurgical changes most compatible with a right upper lobectomy. Possible right middle lobectomy as well. Few hazy densities in the right hilar region are nonspecific. Patchy densities in the lingula and left lower lobe are nonspecific but may represent atelectasis. Upper Abdomen: Diffuse low-density in the visualized liver is suggestive for hepatic steatosis. Visualized adrenal tissue is unremarkable. Musculoskeletal: No suspicious bone findings. IMPRESSION: 1. Nodule in the right upper lung measuring up to 1.5 cm. There is concern for an enlarged right hilar lymph node. Findings are concerning for primary lung cancer. In addition, there are small indeterminate right supraclavicular lymph nodes. Patient has had partial right lung resection and this could represent recurrent disease. Consider further characterization with a PET-CT. 2. Patchy densities in the left lung are difficult to characterize due to motion artifact but findings are likely related to atelectasis. Subtle infectious or inflammatory process in left lung is difficult to exclude. 3. Hepatic steatosis. 4.  Aortic Atherosclerosis (ICD10-I70.0). These results will be called to the ordering clinician or  representative by the Radiologist Assistant, and communication documented in the PACS or zVision Dashboard. Electronically Signed   By: Markus Daft M.D.   On: 12/09/2018 10:42    Scheduled Meds: . allopurinol  100 mg Oral Daily  . donepezil  10 mg Oral Daily  . enoxaparin (LOVENOX) injection  40 mg Subcutaneous Q24H  . guaiFENesin  600 mg Oral BID  . liver oil-zinc oxide   Topical QID  . losartan  25 mg Oral Daily  . sertraline  50 mg Oral QHS  . vitamin B-12  1,000 mcg Oral Daily    Continuous Infusions:    Time spent: 16mins I have personally reviewed and interpreted on  12/09/2018 daily labs,  imagings as discussed above under date review session and assessment and plans.  I reviewed all nursing notes, pharmacy notes,  vitals, pertinent old records  I have discussed plan of care as described above with RN , patient and family on 12/09/2018   Florencia Reasons MD, PhD  [facility] Pager [phone]. If 7PM-7AM, please contact night-coverage at [email or web address], password [email or web address] 12/09/2018, 11:12 AM  LOS: 1 day

## 2018-12-09 NOTE — Consult Note (Signed)
[facility] Face-to-Face Psychiatry Consult   Reason for Consult:  SI Referring Physician:  Dr. 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 female patient admitted with acute metabolic encephalopathy.  HPI:   Per chart review, patient was admitted with acute metabolic encephalopathy with baseline vascular dementia. She had leukocytosis, fever to 100.8 and dry cough on admission. Her workup has been unremarkable. 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 She has been living at a SNF for several years. She is wheelchair bound and has bowel and bladder incontinence. Her husband passed away 3 years ago. She reportedly endorsed SI to staff at her nursing home. She denied these statements to her daughter and did not answer when asked by her primary team if she was suicidal.   On interview, Mariah Mann is minimally responsive to questions. She endorses nausea and has an emesis bag at bedside. She denies problems with pain. She appears drowsy and falls asleep following brief interview. Her bedside sitter reports that she was more interactive with her daughter earlier today. She provides verbal consent to speak to her daughter. Her daughter was contacted by phone. She reports that she was more talkative on Wednesday and is usually more engaging in conversation. She asked her mother about making suicidal statements and she denied SI. Her daughter reports that she does not have a prior psychiatric history. She has never attempted suicide. She was at her  baseline just prior to becoming ill.   Past Psychiatric History: Denies   Risk to Self:   UTA due to AMS/patient is poorly participating in interview. Risk to Others:   UTA due to AMS/patient is poorly participating in interview. Prior Inpatient Therapy:  Patient's daughter denies.  Prior Outpatient Therapy:   Patient's daughter denies.   Past Medical History:  Past Medical History:  Diagnosis Date  . Arthritis   . Cancer ([place])   . Chronic kidney disease   . Dementia ([place])   . Depression   . Diabetes mellitus without complication ([place])   . Epilepsy ([place])   . GERD (gastroesophageal reflux disease)   . Gout   . Hyperlipidemia   . Hypertension   . Renal disorder   . Seizures ([place])   . Stroke ([place])   . Urinary incontinence    History reviewed. No pertinent surgical history. Family History: History reviewed. No pertinent family history. Family Psychiatric  History: Patient's daughter denies.  Social History:  Social History   Substance and Sexual Activity  Alcohol Use Never  . Frequency: Never     Social History   Substance and Sexual Activity  Drug Use Never    Social History   Socioeconomic History  . Marital status: Single    Spouse name: Not on file  . Number of children: Not on file  . Years of education: Not on file  . Highest education level: Not on file  Occupational History  . Not on file  Social Needs  . Financial resource strain: Not on file  . Food insecurity:  Worry: Not on file    Inability: Not on file  . Transportation needs:    Medical: Not on file    Non-medical: Not on file  Tobacco Use  . Smoking status: Never Smoker  . Smokeless tobacco: Never Used  Substance and Sexual Activity  . Alcohol use: Never    Frequency: Never  . Drug use: Never  . Sexual activity: Not on file  Lifestyle  . Physical activity:    Days per week: Not on file    Minutes per session: Not on file  . Stress: Not on file  Relationships  . Social  connections:    Talks on phone: Not on file    Gets together: Not on file    Attends religious service: Not on file    Active member of club or organization: Not on file    Attends meetings of clubs or organizations: Not on file    Relationship status: Not on file  Other Topics Concern  . Not on file  Social History Narrative  . Not on file   Additional Social History: She lives in a nursing facility x 3 years. She is a widow. Her husband passed away 3 years ago. She has one adult daughter. She last worked at [employer] 40 years ago. She does not use alcohol or illicit substances.     Allergies:   Allergies  Allergen Reactions  . Contrast Media [Iodinated Diagnostic Agents] Other (See Comments)  . Iodine Other (See Comments)  . Keflex [Cephalexin] Other (See Comments)  . Macrodantin [Nitrofurantoin] Other (See Comments)  . Penicillins Other (See Comments)    Labs:  Results for orders placed or performed during the hospital encounter of 12/06/18 (from the past 48 hour(s))  Influenza panel by PCR (type A & B)     Status: None   Collection Time: 12/07/18 12:38 PM  Result Value Ref Range   Influenza A By PCR NEGATIVE NEGATIVE   Influenza B By PCR NEGATIVE NEGATIVE    Comment: (NOTE) The Xpert Xpress Flu assay is intended as an aid in the diagnosis of  influenza and should not be used as a sole basis for treatment.  This  assay is FDA approved for nasopharyngeal swab specimens only. Nasal  washings and aspirates are unacceptable for Xpert Xpress Flu testing. Performed at [facility], [place] [address] [place], [place] [address]   MRSA PCR Screening     Status: None   Collection Time: 12/07/18  1:40 PM  Result Value Ref Range   MRSA by PCR NEGATIVE NEGATIVE    Comment:        The GeneXpert MRSA Assay (FDA approved for NASAL specimens only), is one component of a comprehensive MRSA colonization surveillance program. It is not intended to  diagnose MRSA infection nor to guide or monitor treatment for MRSA infections. Performed at [facility], [place] [address], [place], [place] [address]   Procalcitonin     Status: None   Collection Time: 12/08/18  6:17 AM  Result Value Ref Range   Procalcitonin 0.40 ng/mL    Comment:        Interpretation: PCT (Procalcitonin) <= 0.5 ng/mL: Systemic infection (sepsis) is not likely. Local bacterial infection is possible. (NOTE)       Sepsis PCT Algorithm           Lower Respiratory Tract  Infection PCT Algorithm    ----------------------------     ----------------------------         PCT < 0.25 ng/mL                PCT < 0.10 ng/mL         Strongly encourage             Strongly discourage   discontinuation of antibiotics    initiation of antibiotics    ----------------------------     -----------------------------       PCT 0.25 - 0.50 ng/mL            PCT 0.10 - 0.25 ng/mL               OR       >80% decrease in PCT            Discourage initiation of                                            antibiotics      Encourage discontinuation           of antibiotics    ----------------------------     -----------------------------         PCT >= 0.50 ng/mL              PCT 0.26 - 0.50 ng/mL               AND        <80% decrease in PCT             Encourage initiation of                                             antibiotics       Encourage continuation           of antibiotics    ----------------------------     -----------------------------        PCT >= 0.50 ng/mL                  PCT > 0.50 ng/mL               AND         increase in PCT                  Strongly encourage                                      initiation of antibiotics    Strongly encourage escalation           of antibiotics                                     -----------------------------                                           PCT <= 0.25 ng/mL  OR                                        > 80% decrease in PCT                                     Discontinue / Do not initiate                                             antibiotics Performed at [facility] [address], [place], [place] [address]   CBC with Differential/Platelet     Status: None   Collection Time: 12/08/18  6:17 AM  Result Value Ref Range   WBC 9.9 4.0 - 10.5 K/uL   RBC 4.00 3.87 - 5.11 MIL/uL   Hemoglobin 12.1 12.0 - 15.0 g/dL   HCT 39.0 36.0 - 46.0 %   MCV 97.5 80.0 - 100.0 fL   MCH 30.3 26.0 - 34.0 pg   MCHC 31.0 30.0 - 36.0 g/dL   RDW 13.7 11.5 - 15.5 %   Platelets 184 150 - 400 K/uL   nRBC 0.0 0.0 - 0.2 %   Neutrophils Relative % 72 %   Neutro Abs 7.3 1.7 - 7.7 K/uL   Lymphocytes Relative 18 %   Lymphs Abs 1.7 0.7 - 4.0 K/uL   Monocytes Relative 7 %   Monocytes Absolute 0.6 0.1 - 1.0 K/uL   Eosinophils Relative 2 %   Eosinophils Absolute 0.2 0.0 - 0.5 K/uL   Basophils Relative 0 %   Basophils Absolute 0.0 0.0 - 0.1 K/uL   Immature Granulocytes 1 %   Abs Immature Granulocytes 0.05 0.00 - 0.07 K/uL    Comment: Performed at [facility], [place] [address], [place], [place] [address]  Basic metabolic panel     Status: Abnormal   Collection Time: 12/08/18  6:17 AM  Result Value Ref Range   Sodium 138 135 - 145 mmol/L   Potassium 4.3 3.5 - 5.1 mmol/L   Chloride 107 98 - 111 mmol/L   CO2 21 (L) 22 - 32 mmol/L   Glucose, Bld 142 (H) 70 - 99 mg/dL   BUN 11 8 - 23 mg/dL   Creatinine, Ser 1.07 (H) 0.44 - 1.00 mg/dL   Calcium 8.7 (L) 8.9 - 10.3 mg/dL   GFR calc non Af Amer 52 (L) >60 mL/min   GFR calc Af Amer >60 >60 mL/min   Anion gap 10 5 - 15    Comment: Performed at [facility], [place] [address] [place], [place] [address]  Magnesium     Status: Abnormal   Collection Time: 12/08/18  6:17 AM  Result Value Ref Range   Magnesium 1.6 (L) 1.7 - 2.4 mg/dL     Comment: Performed at [facility], [place] [address] [place], [place] [address]  Procalcitonin     Status: None   Collection Time: 12/09/18  7:30 AM  Result Value Ref Range   Procalcitonin 0.38 ng/mL    Comment:        Interpretation: PCT (Procalcitonin) <= 0.5 ng/mL: Systemic infection (sepsis) is not likely. Local bacterial infection is possible. (NOTE)  Sepsis PCT Algorithm           Lower Respiratory Tract                                      Infection PCT Algorithm    ----------------------------     ----------------------------         PCT < 0.25 ng/mL                PCT < 0.10 ng/mL         Strongly encourage             Strongly discourage   discontinuation of antibiotics    initiation of antibiotics    ----------------------------     -----------------------------       PCT 0.25 - 0.50 ng/mL            PCT 0.10 - 0.25 ng/mL               OR       >80% decrease in PCT            Discourage initiation of                                            antibiotics      Encourage discontinuation           of antibiotics    ----------------------------     -----------------------------         PCT >= 0.50 ng/mL              PCT 0.26 - 0.50 ng/mL               AND        <80% decrease in PCT             Encourage initiation of                                             antibiotics       Encourage continuation           of antibiotics    ----------------------------     -----------------------------        PCT >= 0.50 ng/mL                  PCT > 0.50 ng/mL               AND         increase in PCT                  Strongly encourage                                      initiation of antibiotics    Strongly encourage escalation           of antibiotics                                     -----------------------------  PCT <= 0.25 ng/mL                                                 OR                                         > 80% decrease in PCT                                     Discontinue / Do not initiate                                             antibiotics Performed at [facility] [address], [place], [place] [address]   Basic metabolic panel     Status: Abnormal   Collection Time: 12/09/18  7:30 AM  Result Value Ref Range   Sodium 137 135 - 145 mmol/L   Potassium 4.4 3.5 - 5.1 mmol/L   Chloride 106 98 - 111 mmol/L   CO2 21 (L) 22 - 32 mmol/L   Glucose, Bld 162 (H) 70 - 99 mg/dL   BUN 11 8 - 23 mg/dL   Creatinine, Ser 1.08 (H) 0.44 - 1.00 mg/dL   Calcium 9.1 8.9 - 10.3 mg/dL   GFR calc non Af Amer 52 (L) >60 mL/min   GFR calc Af Amer 60 (L) >60 mL/min   Anion gap 10 5 - 15    Comment: Performed at [place] [address], [place], [place] [address]  Magnesium     Status: None   Collection Time: 12/09/18  7:30 AM  Result Value Ref Range   Magnesium 2.0 1.7 - 2.4 mg/dL    Comment: Performed at [facility], [place] [address], [place], [place] [address]  Hemoglobin A1c     Status: Abnormal   Collection Time: 12/09/18  7:30 AM  Result Value Ref Range   Hgb A1c MFr Bld 7.2 (H) 4.8 - 5.6 %    Comment: (NOTE) Pre diabetes:          5.7%-6.4% Diabetes:              >6.4% Glycemic control for   <7.0% adults with diabetes    Mean Plasma Glucose 159.94 mg/dL    Comment: Performed at [facility] [address], [place], [place] [address]  Uric acid     Status: None   Collection Time: 12/09/18  7:30 AM  Result Value Ref Range   Uric Acid, Serum 5.7 2.5 - 7.1 mg/dL    Comment: Performed at [facility], [place] [address], [place],  [address]  Lipid panel     Status: Abnormal   Collection Time: 12/09/18  7:30 AM  Result Value Ref Range   Cholesterol 140 0 - 200 mg/dL   Triglycerides 58 <150 mg/dL   HDL 34 (L) >40 mg/dL   Total CHOL/HDL Ratio 4.1 RATIO   VLDL 12 0 - 40 mg/dL   LDL  Cholesterol 94 0 - 99 mg/dL  Comment:        Total Cholesterol/HDL:CHD Risk Coronary Heart Disease Risk Table                     Men   Women  1/2 Average Risk   3.4   3.3  Average Risk       5.0   4.4  2 X Average Risk   9.6   7.1  3 X Average Risk  23.4   11.0        Use the calculated Patient Ratio above and the CHD Risk Table to determine the patient's CHD Risk.        ATP III CLASSIFICATION (LDL):  <100     mg/dL   Optimal  100-129  mg/dL   Near or Above                    Optimal  130-159  mg/dL   Borderline  160-189  mg/dL   High  >190     mg/dL   Very High Performed at [facility] [address], [place], [place] [address]     Current Facility-Administered Medications  Medication Dose Route Frequency Provider Last Rate Last Dose  . acetaminophen (TYLENOL) tablet 650 mg  650 mg Oral Q6H PRN Etta Quill, DO       Or  . acetaminophen (TYLENOL) suppository 650 mg  650 mg Rectal Q6H PRN Etta Quill, DO   650 mg at 12/07/18 0704  . allopurinol (ZYLOPRIM) tablet 100 mg  100 mg Oral Daily Florencia Reasons, MD   100 mg at 12/09/18 0932  . donepezil (ARICEPT) tablet 10 mg  10 mg Oral Daily Florencia Reasons, MD   10 mg at 12/09/18 0932  . enoxaparin (LOVENOX) injection 40 mg  40 mg Subcutaneous Q24H Jennette Kettle M, DO   40 mg at 12/09/18 0805  . guaiFENesin (MUCINEX) 12 hr tablet 600 mg  600 mg Oral BID Florencia Reasons, MD   600 mg at 12/09/18 0932  . guaiFENesin-dextromethorphan (ROBITUSSIN DM) 100-10 MG/5ML syrup 5 mL  5 mL Oral Q4H PRN Florencia Reasons, MD   5 mL at 12/09/18 0603  . liver oil-zinc oxide (DESITIN) 40 % ointment   Topical QID Florencia Reasons, MD      . losartan (COZAAR) tablet 25 mg  25 mg Oral Daily Florencia Reasons, MD   25 mg at 12/09/18 0931  . ondansetron (ZOFRAN) tablet 4 mg  4 mg Oral Q6H PRN Etta Quill, DO       Or  . ondansetron [facility]) injection 4 mg  4 mg Intravenous Q6H PRN Etta Quill, DO      . sertraline (ZOLOFT) tablet 50 mg  50 mg Oral Vonzell Schlatter, MD   50 mg at 12/08/18 2004  . vitamin B-12 (CYANOCOBALAMIN) tablet 1,000 mcg  1,000 mcg Oral Daily Florencia Reasons, MD   1,000 mcg at 12/09/18 6045    Musculoskeletal: Strength & Muscle Tone: Right sided hemiplegia. Gait & Station: unable to stand Patient leans: N/A  Psychiatric Specialty Exam: Physical Exam  Nursing note and vitals reviewed. Constitutional: She appears well-developed and well-nourished.  HENT:  Head: Normocephalic and atraumatic.  Neck: Normal range of motion.  Respiratory: Effort normal.  Musculoskeletal: Normal range of motion.  Neurological: She is alert.  Psychiatric: Judgment and thought content normal. Her affect is blunt. Her speech is delayed. She is slowed. Cognition and memory are impaired.  Review of Systems  Gastrointestinal: Negative for nausea.  Musculoskeletal: Negative.   All other systems reviewed and are negative.   Blood pressure (!) 155/89, pulse 86, temperature 99.1 F (37.3 C), temperature source Oral, resp. rate 19, SpO2 98 %.There is no height or weight on file to calculate BMI.  General Appearance: Fairly Groomed, elderly, African American female, wearing a hospital gown and lying in bed. NAD.   Eye Contact:  Fair  Speech:  Slow and Slurred  Volume:  Decreased  Mood:  Dysphoric  Affect:  Congruent  Thought Process:  Linear and Descriptions of Associations: Intact  Orientation:  Other:  UTA due to AMS/patient is poorly participating in interview.  Thought Content:  Logical  Suicidal Thoughts:  UTA due to AMS/patient is poorly participating in interview.  Homicidal Thoughts:  UTA due to AMS/patient is poorly participating in interview.  Memory:  Immediate;   UTA due to AMS/patient is poorly participating in interview. Recent;   UTA due to AMS/patient is poorly participating in interview. Remote;   UTA due to AMS/patient is poorly participating in interview.  Judgement:  Poor  Insight:  Shallow  Psychomotor Activity:  Decreased   Concentration:  Concentration: UTA due to AMS/patient is poorly participating in interview. and Attention Span: UTA due to AMS/patient is poorly participating in interview.  Recall:  UTA due to AMS/patient is poorly participating in interview.  Fund of Knowledge:  UTA due to AMS/patient is poorly participating in interview.  Language:  UTA due to AMS/patient is poorly participating in interview.  Akathisia:  No  Handed:  Right  AIMS (if indicated):   N/A  Assets:  Financial Resources/Insurance Housing Social Support  ADL's:  Impaired  Cognition:  Impaired. History of dementia.   Sleep:   N/A   Assessment:  Mariah Mann is a 73 y.o. female who was admitted with acute metabolic encephalopathy with baseline vascular dementia. Patient is minimally responsive so collateral was obtained from her daughter. She reportedly was at her baseline until she became ill. Her presentation is most consistent with hypoactive delirium versus selective mutism. She does not have a prior psychiatric history or a history of suicide attempts. She reportedly denied SI to her daughter so it is possible that she endorsed SI to staff at her nursing home when she became altered. She has not demonstrated unsafe behavior in the hospital. She is bed to wheelchair bound and has a low risk of harming self. Her daughter was educated about return precautions if she feels like her mother is an imminent risk of harm to self. She denies any concerns for safety at this time. She does not warrant inpatient psychiatric hospitalization at this time.   Treatment Plan Summary: -Patient does not demonstrate unsafe behaviors and is low risk of imminent risk of harm to self. Daughter denies safety concerns at this time and was educated about return precautions to the hospital.  -Patient does not warrant psychotropic medications at this time.  -Please have SW provide patient with outpatient mental health resources if needed in the future.   -EKG reviewed and QTc 434 on 1/7. Please closely monitor when starting or increasing QTc prolonging agents.   -Psychiatry will sign off on patient at this time. Please consult psychiatry again as needed.    Disposition: No evidence of imminent risk to self or others at present.    Faythe Dingwall, DO 12/09/2018 12:15 PM

## 2018-12-09 NOTE — Discharge Summary (Addendum)
Discharge Summary  Mariah Mann GYF:[id number] DOB: 1945/12/30  PCP: Garwin Brothers, MD  Admit date: 12/06/2018 Discharge date: 12/09/2018  Time spent: 67mins, more than 50% time spent on coordination of care.  Recommendations for Outpatient Follow-up:  1. F/u with SNF MD  for hospital discharge follow up, repeat cbc/bmp at follow up. 2. F/u with oncology for lung cancer evaluation (message sent to Dr Julien Nordmann) 3. Neurology for dementia eval, h/o CVA 4. Recommend Palliative care to follow patient and family at SNF  Discharge Diagnoses:  Active Hospital Problems   Diagnosis Date Noted  . Acute encephalopathy 12/07/2018  . Malignant neoplasm of upper lobe of right lung ([place])   . Encephalopathy acute 12/08/2018  . Aphasia   . Vascular dementia without behavioral disturbance ([place])   . Right hemiplegia ([place])   . Hypomagnesemia   . HTN (hypertension) 12/07/2018  . Debility 12/07/2018  . Depression 12/07/2018    Resolved Hospital Problems  No resolved problems to display.    Discharge Condition: stable  Diet recommendation: heart healthy/carb modified  SLP Diet Recommendations: Dysphagia 3 (Mech soft) solids;Thin liquid   Liquid Administration via: Straw;Cup   Medication Administration: Whole meds with puree(crush if orally holding, ? liquid suspension?)   Supervision: Patient able to self feed   Compensations: Slow rate;Small sips/bites;Other (Comment);Minimize environmental distractions(clean mouth after meals, pt must expectorate if orally holding extensively)   Postural Changes: Seated upright at 90 degrees;Remain semi-upright after after feeds/meals (Comment)   Oral Care Recommendations: Other (Comment)(oral care after meals)  There were no vitals filed for this visit.  History of present illness: (per admitting MD Dr Alcario Drought) PCP: Garwin Brothers, MD  Patient coming from: SNF  I have personally briefly reviewed patient's old medical records in [place]  Chief Complaint: AMS  HPI: Mariah Mann is a 73 y.o. female with medical history significant of prior stroke, HTN, HLD, dementia.  Patient presents to the ED with concern for not speaking and decreased speech over the past couple of days.  According to daughter at bedside she was speaking clearly last week.  Due to concern for depression (SI even) at the SNF last week, she was started on zoloft x3 days ago.  Over the past 3 days she has had decreased speaking.  This is new per family.  Several years ago she had stroke causing R sided hemiplegia.  She has been sent in to the ED due to speech / mental status change.   ED Course: Patient also seems to have cough / spitting up secretions.  This is also new per daughter.  CXR neg, WBC 12.9k.  UA neg.  Satting 97% on RA.  Hospital Course:  Principal Problem:   Acute encephalopathy Active Problems:   HTN (hypertension)   Debility   Depression   Encephalopathy acute   Aphasia   Vascular dementia without behavioral disturbance (HCC)   Right hemiplegia (HCC)   Hypomagnesemia   Malignant neoplasm of upper lobe of right lung (HCC)   Acute metabolic encephalopathy with baseline vascular dementia -MRI brain on presentation "Chronic ischemic microangiopathy and severe volume loss without acute abnormality." -EEG "This is an abnormal electroencephalogram secondary to increased left hemispheric amplitude and intermittent left frontal sharp transients. Patient with a history of left craniotomy. " -per daughter patient is more lethargic, less talkative  On presentation. -she is dehydrated on presentation, she appear more awake with hydration.  Leukocytosis Wbc 12.8 on presentation Normalized with hydration  Fever/dry cough She presents with  fever 100.8, could be due  To atelectasis and lung nodule Blood culture/urine culture negative, mrsa screening negative, flu swab negtive She does has dry cough , per daughter this is  new cxr on presentation "Low volume chest without acute finding ", procalcitonin 0.4, no wheezing, no rhonchi on exam She has swallow eval: "SLP Diet Recommendations: Dysphagia 3 (Mech soft) solids;Thin liquid   Due to persistent cough a Ct chest obtained, it did not show infection, does show atelectasis and 1.5cm right upper lobe lung nodule with enlarged right hilar lymph node. I have discussed with daughter over the phone, advised to follow up with oncology regarding further options, she agrees with the plan  Daughter reports patient had lung cancer with possible brain mets 28yrs ago, patient underwent surgery ( resection of the lung cancer), patient also underwent craniotomy but no brain mets found at the time.  Outpatient oncology follow up.  Hypomagnesemia: Replaced and normalized  HTN: she is on losartan , now well controlled, start low dose lopressor, SNF MD to continue adjust bp meds  H/o CVA with aphasia, right sided hemiplegia from prior CVA -I donot see asa or statin on home meds list, daughter is now sure about these meds, daughter reports patient has not seen a neurologist since her last cva -ldl 94, she is started on low dose lipitor. -recommend neurology follow up.  Vascular dementia: She is on aricept  FTT: per daughter , patient has been bed to wheelchair bound, with bowel and bladder incontinence for years. She has been at [facility] for several years.  Per nursing home, patient has suicidal ideation, per daughter patient denies SI to her She is evaluated by psychiatry,  she is to discharge back to SNF   Elevated fasting blood glucose with a1c7% Carb modified diet, start metformin, SNF MD to monitor blood glucose control.   Code Status: full, verified with daughter over the phone  Family Communication: patient , daughter over the phone  Disposition Plan: return to SNF after psychiatry eval   Consultants:  psychiatry  Procedures:  MBS    Antibiotics:  None  Discharge Exam: BP (!) 181/85 (BP Location: Left Arm)   Pulse 94   Temp 98.8 F (37.1 C) (Oral)   Resp 20   SpO2 97%    General:  Demented elderly, does not appear in distress, she does not talk, does not follow commands  Cardiovascular: RRR  Respiratory: diminished at basis, no wheezing, no rales, no rhonchi   Abdomen: Soft/ND/NT, positive BS  Musculoskeletal: right sided hemiplegia, bilateral foot drop  Neuro: alert, does not talk  Discharge Instructions You were cared for by a hospitalist during your hospital stay. If you have any questions about your discharge medications or the care you received while you were in the hospital after you are discharged, you can call the unit and asked to speak with the hospitalist on call if the hospitalist that took care of you is not available. Once you are discharged, your primary care physician will handle any further medical issues. Please note that NO REFILLS for any discharge medications will be authorized once you are discharged, as it is imperative that you return to your primary care physician (or establish a relationship with a primary care physician if you do not have one) for your aftercare needs so that they can reassess your need for medications and monitor your lab values.  Discharge Instructions    Diet - low sodium heart healthy   Complete by:  As directed    Carb modified diet   Increase activity slowly   Complete by:  As directed      Allergies as of 12/09/2018      Reactions   Contrast Media [iodinated Diagnostic Agents] Other (See Comments)   Iodine Other (See Comments)   Keflex [cephalexin] Other (See Comments)   Macrodantin [nitrofurantoin] Other (See Comments)   Penicillins Other (See Comments)      Medication List    STOP taking these medications   sertraline 50 MG tablet Commonly known as:  ZOLOFT     TAKE these medications   acetaminophen 650 MG CR tablet Commonly known as:   TYLENOL Take 650 mg by mouth every 6 (six) hours as needed for pain.   allopurinol 100 MG tablet Commonly known as:  ZYLOPRIM Take 100 mg by mouth daily.   atorvastatin 10 MG tablet Commonly known as:  LIPITOR Take 1 tablet (10 mg total) by mouth daily at 6 PM for 30 days.   cholecalciferol 10 MCG/ML Liqd Commonly known as:  D-VI-SOL Take 2,000 Units by mouth daily.   donepezil 10 MG tablet Commonly known as:  ARICEPT Take 10 mg by mouth daily.   EUCERIN EX Apply 1 application topically 2 (two) times daily. Apply to both legs and feet   guaiFENesin 600 MG 12 hr tablet Commonly known as:  MUCINEX Take 1 tablet (600 mg total) by mouth 2 (two) times daily.   losartan 25 MG tablet Commonly known as:  COZAAR Take 25 mg by mouth daily.   magnesium gluconate 30 MG tablet Commonly known as:  MAGONATE Take 1 tablet (30 mg total) by mouth every Monday, Wednesday, and Friday.   metFORMIN 500 MG tablet Commonly known as:  GLUCOPHAGE Take 1 tablet (500 mg total) by mouth 2 (two) times daily with a meal.   metoprolol tartrate 25 MG tablet Commonly known as:  LOPRESSOR Take 0.5 tablets (12.5 mg total) by mouth 2 (two) times daily.   thiamine 100 MG tablet Commonly known as:  VITAMIN B-1 Take 1 tablet (100 mg total) by mouth daily.   vitamin B-12 1000 MCG tablet Commonly known as:  CYANOCOBALAMIN Take 1,000 mcg by mouth daily.   VITAMIN C PO Take 1 tablet by mouth daily.      Allergies  Allergen Reactions  . Contrast Media [Iodinated Diagnostic Agents] Other (See Comments)  . Iodine Other (See Comments)  . Keflex [Cephalexin] Other (See Comments)  . Macrodantin [Nitrofurantoin] Other (See Comments)  . Penicillins Other (See Comments)   Follow-up Information    Garwin Brothers, MD Follow up.   Specialty:  Internal Medicine Contact information: [address] [place] [address] [phone]        [facility] Follow up in 1  week(s).   Specialty:  Oncology Why:  lung cancer evaluation  Contact information: [id number] [place] [address] [phone]       Curt Bears, MD Follow up.   Specialty:  Oncology Why:  lung cancer evaluation Contact information: [place] [address] [phone]        [facility] Follow up.   Why:  for h/o cva and dementia eval Contact information: [address]     [place] [address] [phone]           The results of significant diagnostics from this hospitalization (including imaging, microbiology, ancillary and  laboratory) are listed below for reference.    Significant Diagnostic Studies: Dg Chest 2 View  Result Date: 12/06/2018 CLINICAL DATA:  73 y/o  F; AMS. EXAM: CHEST - 2 VIEW COMPARISON:  08/13/2015 chest radiograph FINDINGS: Stable normal cardiac silhouette. Aortic atherosclerosis with calcification. Clear lungs. No pleural effusion or pneumothorax. No acute osseous abnormality is evident. IMPRESSION: No acute pulmonary process identified. Electronically Signed   By: Kristine Garbe M.D.   On: 12/06/2018 18:52   Ct Chest Wo Contrast  Result Date: 12/09/2018 CLINICAL DATA:  73 year old with persistent cough. EXAM: CT CHEST WITHOUT CONTRAST TECHNIQUE: Multidetector CT imaging of the chest was performed following the standard protocol without IV contrast. COMPARISON:  Chest radiograph 12/07/2018 FINDINGS: Cardiovascular: Atherosclerotic calcifications in the thoracic aorta without aneurysm. Heart size is normal without significant pericardial fluid. Mediastinum/Nodes: Calcifications in the mediastinum compatible with old granulomatous disease. Concern for a low-density enlarged lymph node in the right hilar region on sequence 3, image 66 measuring up to 1.8 cm. Difficult to exclude small left hilar nodes on this noncontrast  examination. No significant axillary lymph node enlargement. No gross abnormality to the thyroid tissue. Small right supraclavicular lymph nodes. Lungs/Pleura: There is a lobulated nodule in the right upper lung on sequence 8, image 34 that measures 1.2 x 1.5 x 1.3 cm. This is concerning for a neoplasm. Postsurgical changes most compatible with a right upper lobectomy. Possible right middle lobectomy as well. Few hazy densities in the right hilar region are nonspecific. Patchy densities in the lingula and left lower lobe are nonspecific but may represent atelectasis. Upper Abdomen: Diffuse low-density in the visualized liver is suggestive for hepatic steatosis. Visualized adrenal tissue is unremarkable. Musculoskeletal: No suspicious bone findings. IMPRESSION: 1. Nodule in the right upper lung measuring up to 1.5 cm. There is concern for an enlarged right hilar lymph node. Findings are concerning for primary lung cancer. In addition, there are small indeterminate right supraclavicular lymph nodes. Patient has had partial right lung resection and this could represent recurrent disease. Consider further characterization with a PET-CT. 2. Patchy densities in the left lung are difficult to characterize due to motion artifact but findings are likely related to atelectasis. Subtle infectious or inflammatory process in left lung is difficult to exclude. 3. Hepatic steatosis. 4.  Aortic Atherosclerosis (ICD10-I70.0). These results will be called to the ordering clinician or representative by the Radiologist Assistant, and communication documented in the PACS or zVision Dashboard. Electronically Signed   By: Markus Daft M.D.   On: 12/09/2018 10:42   Mr Brain Wo Contrast  Result Date: 12/06/2018 CLINICAL DATA:  Altered mental status. EXAM: MRI HEAD WITHOUT CONTRAST TECHNIQUE: Multiplanar, multiecho pulse sequences of the brain and surrounding structures were obtained without intravenous contrast. COMPARISON:  Head CT  08/13/2015 FINDINGS: BRAIN: There is no acute infarct, acute hemorrhage, hydrocephalus or extra-axial collection. The midline structures are normal. No midline shift or other mass effect. Diffuse confluent hyperintense T2-weighted signal within the periventricular, deep and juxtacortical white matter, most commonly due to chronic ischemic microangiopathy. Bifrontal encephalomalacia of may be due to prior trauma or ischemia. There is diffuse, severe atrophy. 5-10 scattered foci of chronic microhemorrhage. VASCULAR: Major intracranial arterial and venous sinus flow voids are normal. SKULL AND UPPER CERVICAL SPINE: Remote left craniotomy. SINUSES/ORBITS: No fluid levels or advanced mucosal thickening. No mastoid or middle ear effusion. The orbits are normal. IMPRESSION: Chronic ischemic microangiopathy and severe volume loss without acute abnormality. Electronically Signed   By: Lennette Bihari  [place] M.D.   On: 12/06/2018 20:20   Dg Chest Port 1 View  Result Date: 12/07/2018 CLINICAL DATA:  Cough EXAM: PORTABLE CHEST 1 VIEW COMPARISON:  Yesterday FINDINGS: Normal heart size. Low lung volumes with interstitial crowding. There is no edema, consolidation, effusion, or pneumothorax. IMPRESSION: Low volume chest without acute finding. Electronically Signed   By: Monte Fantasia M.D.   On: 12/07/2018 05:28   Dg Swallowing Func-speech Pathology  Result Date: 12/08/2018 Objective Swallowing Evaluation: Type of Study: MBS-Modified Barium Swallow Study  Patient Details Name: Floy Angert MRN: [id number] Date of Birth: 07-10-1946 Today's Date: 12/08/2018 Time: SLP Start Time (ACUTE ONLY): 5427 -SLP Stop Time (ACUTE ONLY): 0912 SLP Time Calculation (min) (ACUTE ONLY): 20 min Past Medical History: Past Medical History: Diagnosis Date . Arthritis  . Cancer ([place])  . Chronic kidney disease  . Dementia ([place])  . Depression  . Diabetes mellitus without complication ([place])  . Epilepsy ([place])  . GERD (gastroesophageal reflux disease)  . Gout  .  Hyperlipidemia  . Hypertension  . Renal disorder  . Seizures ([place])  . Stroke ([place])  . Urinary incontinence  Past Surgical History: No past surgical history on file. HPI: Ms Ameliah is a 73 yo female with worsening aphasia x2 days and possible suicidal ideation a few days ago at facility.  Per MD note, pt was started on Zoloft a few days ago and now with worsening aphasia/mental status.  Pt found to have increased creatinine and to be encephalopathic.  Per family report to MD, pt has been spitting up secretions and this is a new occurence.  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  CXR 12/06/2018 negative, CXR 12/07/2018 negative, Low lung volumes.  Electroencephalogram completed today was abnormal secondary to increased left hemispheric amplitude and intermittent left frontal sharp transients.  Patient with a history of left craniotomy per neurologist note.  RN came during SLP eval to test pt for flur.  Swallow eval ordered.   Subjective: pt awake in chair Assessment / Plan / Recommendation CHL IP CLINICAL IMPRESSIONS 12/08/2018 Clinical Impression Pt presents with mild oral dysphagia due to prior cva impacting motor planning thus resulting in delayed oral transiting, lingual pumping and premature spillage of liquids.  She did have oral residuals with pudding/cracker and mostly cleared with delayed pt initiated swallow.  Pt did NOT swallow barium tablet with thin =  nor added thin or pudding despite max cues and dry spoon, verbal cueing attempts.  She finally expectorated pudding/thin and tablet after several minutes.  Pharyngeal swallow is strong and timely.  Recommend dys3/thin diet with strict aspiration precautions.  If pt is orally holding boluses = she must expectorate them.  Unfortunately pt has purewick in place and thus no oral suction availability.  Medication with puree recommended and have pt feed herself to improve neurological input.    SLP Visit Diagnosis Dysphagia, oral phase (R13.11) Attention and concentration deficit following -- Frontal lobe and executive function deficit following -- Impact on safety and function Mild aspiration risk   CHL IP TREATMENT RECOMMENDATION 12/08/2018 Treatment Recommendations Therapy as outlined in treatment plan below   Prognosis 12/08/2018 Prognosis for Safe Diet Advancement Good Barriers to Reach Goals -- Barriers/Prognosis Comment -- CHL IP DIET RECOMMENDATION 12/08/2018 SLP Diet Recommendations Dysphagia 3 (Mech soft) solids;Thin liquid Liquid Administration via Straw;Cup Medication Administration Whole meds with puree Compensations Slow rate;Small sips/bites;Other (Comment);Minimize environmental distractions Postural Changes Seated  upright at 90 degrees;Remain semi-upright after after feeds/meals (Comment)   CHL IP OTHER RECOMMENDATIONS 12/08/2018 Recommended Consults -- Oral Care Recommendations Other (Comment) Other Recommendations --   CHL IP FOLLOW UP RECOMMENDATIONS 12/08/2018 Follow up Recommendations None   CHL IP FREQUENCY AND DURATION 12/08/2018 Speech Therapy Frequency (ACUTE ONLY) min 1 x/week Treatment Duration 1 week      CHL IP ORAL PHASE 12/08/2018 Oral Phase Impaired Oral - Pudding Teaspoon -- Oral - Pudding Cup -- Oral - Honey Teaspoon -- Oral - Honey Cup -- Oral - Nectar Teaspoon -- Oral - Nectar Cup -- Oral - Nectar Straw Lingual pumping;Premature spillage;Delayed oral transit Oral - Thin Teaspoon -- Oral - Thin Cup -- Oral - Thin Straw Premature spillage;Lingual pumping;Delayed oral transit Oral - Puree Lingual pumping;Delayed oral transit;Piecemeal swallowing;Lingual/palatal residue Oral - Mech Soft Delayed oral transit;Weak lingual manipulation;Piecemeal swallowing;Lingual/palatal residue Oral - Regular -- Oral - Multi-Consistency -- Oral - Pill Holding of bolus Oral Phase - Comment --  CHL IP PHARYNGEAL PHASE 12/08/2018 Pharyngeal Phase WFL Pharyngeal- Pudding Teaspoon -- Pharyngeal -- Pharyngeal-  Pudding Cup -- Pharyngeal -- Pharyngeal- Honey Teaspoon -- Pharyngeal -- Pharyngeal- Honey Cup -- Pharyngeal -- Pharyngeal- Nectar Teaspoon -- Pharyngeal -- Pharyngeal- Nectar Cup -- Pharyngeal -- Pharyngeal- Nectar Straw -- Pharyngeal -- Pharyngeal- Thin Teaspoon -- Pharyngeal -- Pharyngeal- Thin Cup -- Pharyngeal -- Pharyngeal- Thin Straw -- Pharyngeal -- Pharyngeal- Puree -- Pharyngeal -- Pharyngeal- Mechanical Soft -- Pharyngeal -- Pharyngeal- Regular -- Pharyngeal -- Pharyngeal- Multi-consistency -- Pharyngeal -- Pharyngeal- Pill -- Pharyngeal -- Pharyngeal Comment --  CHL IP CERVICAL ESOPHAGEAL PHASE 12/08/2018 Cervical Esophageal Phase WFL Pudding Teaspoon -- Pudding Cup -- Honey Teaspoon -- Honey Cup -- Nectar Teaspoon -- Nectar Cup -- Nectar Straw -- Thin Teaspoon -- Thin Cup -- Thin Straw -- Puree -- Mechanical Soft -- Regular -- Multi-consistency -- Pill -- Cervical Esophageal Comment -- Macario Golds 12/08/2018, 2:53 PM    Luanna Salk, MS [facility] SLP Acute Rehab Services Pager [phone] Office [phone]            Microbiology: Recent Results (from the past 240 hour(s))  Urine culture     Status: None   Collection Time: 12/06/18  5:40 PM  Result Value Ref Range Status   Specimen Description   Final    URINE, RANDOM Performed at [facility], [place] [address], [place], [place] [address]    Special Requests   Final    NONE Performed at [facility], [place] [address], [place], [place] [address]    Culture   Final    NO GROWTH Performed at [facility], [place] [address], [place], [place] [address]    Report Status 12/08/2018 FINAL  Final  Culture, blood (routine x 2)     Status: None (Preliminary result)   Collection Time: 12/07/18  9:07 AM  Result Value Ref Range Status   Specimen Description   Final    BLOOD LEFT HAND Performed at [facility] [address], [place], [place] [address]    Special Requests   Final     BOTTLES DRAWN AEROBIC ONLY Blood Culture adequate volume Performed at [facility] [address] [place], [place] [address]    Culture   Final    NO GROWTH 2 DAYS Performed at [facility] [address] [place], [place] [address]    Report Status PENDING  Incomplete  Culture, blood (routine x 2)  Status: None (Preliminary result)   Collection Time: 12/07/18  9:12 AM  Result Value Ref Range Status   Specimen Description   Final    BLOOD LEFT HAND Performed at [facility] [address] [place], [place] [address]    Special Requests   Final    BOTTLES DRAWN AEROBIC ONLY Blood Culture adequate volume Performed at [facility] [address] [place], [place] [address]    Culture   Final    NO GROWTH 2 DAYS Performed at [facility] [address] [place], [place] [address]    Report Status PENDING  Incomplete  MRSA PCR Screening     Status: None   Collection Time: 12/07/18  1:40 PM  Result Value Ref Range Status   MRSA by PCR NEGATIVE NEGATIVE Final    Comment:        The GeneXpert MRSA Assay (FDA approved for NASAL specimens only), is one component of a comprehensive MRSA colonization surveillance program. It is not intended to diagnose MRSA infection nor to guide or monitor treatment for MRSA infections. Performed at [facility], [place] [address] [place], [place] [address]      Labs: Basic Metabolic Panel: Recent Labs  Lab 12/06/18 1823 12/07/18 0555 12/08/18 0617 12/09/18 0730  NA 138 138 138 137  K 4.5 4.3 4.3 4.4  CL 103 107 107 106  CO2 24 23 21* 21*  GLUCOSE 153* 145* 142* 162*  BUN 15 15 11 11   CREATININE 1.35* 1.17* 1.07* 1.08*  CALCIUM 10.1 9.0 8.7* 9.1  MG  --   --  1.6* 2.0   Liver Function Tests: Recent Labs  Lab 12/06/18 1823 12/07/18 0555  AST 27 26  ALT 29 25  ALKPHOS 78 65  BILITOT 0.9 0.7  PROT 9.0* 7.6  ALBUMIN 4.0 3.4*    Recent Labs  Lab 12/06/18 1823  LIPASE 42   Recent Labs  Lab 12/06/18 1823  AMMONIA 17   CBC: Recent Labs  Lab 12/06/18 1823 12/07/18 0555 12/08/18 0617  WBC 12.8* 12.3* 9.9  NEUTROABS 9.9*  --  7.3  HGB 14.6 12.2 12.1  HCT 46.4* 38.3 39.0  MCV 98.7 98.5 97.5  PLT 190 178 184   Cardiac Enzymes: No results for input(s): CKTOTAL, CKMB, CKMBINDEX, TROPONINI in the last 168 hours. BNP: BNP (last 3 results) No results for input(s): BNP in the last 8760 hours.  ProBNP (last 3 results) No results for input(s): PROBNP in the last 8760 hours.  CBG: Recent Labs  Lab 12/06/18 1855  GLUCAP 135*       Signed:  Florencia Reasons MD, PhD  [facility] 12/09/2018, 3:53 PM

## 2018-12-09 NOTE — Progress Notes (Signed)
Patient returning to [facility].  LCSW confirmed with facility.   LCSW faxed dc docs.   Patient transporting by [facility]AR.   RN report# [phone]  Please call daughter when patient is picked up.   Mariah Mann

## 2018-12-12 ENCOUNTER — Telehealth: Payer: Self-pay | Admitting: Internal Medicine

## 2018-12-12 ENCOUNTER — Encounter: Payer: Self-pay | Admitting: *Deleted

## 2018-12-12 LAB — CULTURE, BLOOD (ROUTINE X 2)
Culture: NO GROWTH
Culture: NO GROWTH
Special Requests: ADEQUATE
Special Requests: ADEQUATE

## 2018-12-12 NOTE — Progress Notes (Signed)
Oncology Nurse Navigator Documentation  Oncology Nurse Navigator Flowsheets 12/12/2018  Navigator Location [facility]-  Referral date to RadOnc/MedOnc 12/12/2018  Navigator Encounter Type Other/Dr. Julien Nordmann updated on referral.  He would like to see patient on 12/27/18. I updated new patient coordinator to call and scheduled patient.   Treatment Phase Abnormal Scans  Barriers/Navigation Needs Coordination of Care  Interventions Coordination of Care  Coordination of Care Other  Acuity Level 2  Time Spent with Patient 30

## 2018-12-12 NOTE — Telephone Encounter (Signed)
A new patient appt has been scheduled for ms. Mariah Mann to see Dr. Julien Nordmann on 1/28 at 2pm. I cld and spoke to Mariah Mann's daughter, Mariah Mann, and provided the appt date and time. Mariah Mann states the pt is in a SNF and she will notify them of the appt. She's aware her mom should arrive 30 minutes early to be checked in on time.

## 2018-12-26 ENCOUNTER — Other Ambulatory Visit: Payer: Self-pay | Admitting: *Deleted

## 2018-12-26 DIAGNOSIS — C3411 Malignant neoplasm of upper lobe, right bronchus or lung: Secondary | ICD-10-CM

## 2018-12-27 ENCOUNTER — Inpatient Hospital Stay: Payer: Medicare Other | Attending: Internal Medicine | Admitting: Internal Medicine

## 2018-12-27 ENCOUNTER — Encounter: Payer: Self-pay | Admitting: Internal Medicine

## 2018-12-27 ENCOUNTER — Inpatient Hospital Stay: Payer: Medicare Other

## 2018-12-27 VITALS — BP 112/95 | HR 72 | Temp 98.4°F | Resp 18

## 2018-12-27 DIAGNOSIS — R4701 Aphasia: Secondary | ICD-10-CM

## 2018-12-27 DIAGNOSIS — Z85118 Personal history of other malignant neoplasm of bronchus and lung: Secondary | ICD-10-CM | POA: Diagnosis not present

## 2018-12-27 DIAGNOSIS — Z8673 Personal history of transient ischemic attack (TIA), and cerebral infarction without residual deficits: Secondary | ICD-10-CM

## 2018-12-27 DIAGNOSIS — I129 Hypertensive chronic kidney disease with stage 1 through stage 4 chronic kidney disease, or unspecified chronic kidney disease: Secondary | ICD-10-CM

## 2018-12-27 DIAGNOSIS — E1122 Type 2 diabetes mellitus with diabetic chronic kidney disease: Secondary | ICD-10-CM

## 2018-12-27 DIAGNOSIS — F0390 Unspecified dementia without behavioral disturbance: Secondary | ICD-10-CM | POA: Diagnosis not present

## 2018-12-27 DIAGNOSIS — Z87891 Personal history of nicotine dependence: Secondary | ICD-10-CM

## 2018-12-27 DIAGNOSIS — N189 Chronic kidney disease, unspecified: Secondary | ICD-10-CM

## 2018-12-27 DIAGNOSIS — G8191 Hemiplegia, unspecified affecting right dominant side: Secondary | ICD-10-CM

## 2018-12-27 DIAGNOSIS — R911 Solitary pulmonary nodule: Secondary | ICD-10-CM

## 2018-12-27 DIAGNOSIS — C3411 Malignant neoplasm of upper lobe, right bronchus or lung: Secondary | ICD-10-CM

## 2018-12-27 DIAGNOSIS — Z809 Family history of malignant neoplasm, unspecified: Secondary | ICD-10-CM

## 2018-12-27 DIAGNOSIS — Z808 Family history of malignant neoplasm of other organs or systems: Secondary | ICD-10-CM

## 2018-12-27 DIAGNOSIS — G9340 Encephalopathy, unspecified: Secondary | ICD-10-CM

## 2018-12-27 DIAGNOSIS — K219 Gastro-esophageal reflux disease without esophagitis: Secondary | ICD-10-CM

## 2018-12-27 LAB — CBC WITH DIFFERENTIAL (CANCER CENTER ONLY)
Abs Immature Granulocytes: 0.02 10*3/uL (ref 0.00–0.07)
Basophils Absolute: 0 10*3/uL (ref 0.0–0.1)
Basophils Relative: 0 %
Eosinophils Absolute: 0.1 10*3/uL (ref 0.0–0.5)
Eosinophils Relative: 1 %
HEMATOCRIT: 40.8 % (ref 36.0–46.0)
Hemoglobin: 13.1 g/dL (ref 12.0–15.0)
Immature Granulocytes: 0 %
LYMPHS ABS: 2.4 10*3/uL (ref 0.7–4.0)
Lymphocytes Relative: 31 %
MCH: 30.3 pg (ref 26.0–34.0)
MCHC: 32.1 g/dL (ref 30.0–36.0)
MCV: 94.2 fL (ref 80.0–100.0)
Monocytes Absolute: 0.5 10*3/uL (ref 0.1–1.0)
Monocytes Relative: 6 %
Neutro Abs: 4.6 10*3/uL (ref 1.7–7.7)
Neutrophils Relative %: 62 %
Platelet Count: 179 10*3/uL (ref 150–400)
RBC: 4.33 MIL/uL (ref 3.87–5.11)
RDW: 13.9 % (ref 11.5–15.5)
WBC Count: 7.6 10*3/uL (ref 4.0–10.5)
nRBC: 0 % (ref 0.0–0.2)

## 2018-12-27 LAB — CMP (CANCER CENTER ONLY)
ALBUMIN: 3.5 g/dL (ref 3.5–5.0)
ALT: 31 U/L (ref 0–44)
AST: 24 U/L (ref 15–41)
Alkaline Phosphatase: 97 U/L (ref 38–126)
Anion gap: 8 (ref 5–15)
BUN: 15 mg/dL (ref 8–23)
CO2: 24 mmol/L (ref 22–32)
Calcium: 9.6 mg/dL (ref 8.9–10.3)
Chloride: 105 mmol/L (ref 98–111)
Creatinine: 1.32 mg/dL — ABNORMAL HIGH (ref 0.44–1.00)
GFR, Est AFR Am: 47 mL/min — ABNORMAL LOW (ref >60)
GFR, Estimated: 40 mL/min — ABNORMAL LOW (ref >60)
GLUCOSE: 149 mg/dL — AB (ref 70–99)
Potassium: 4.8 mmol/L (ref 3.5–5.1)
SODIUM: 137 mmol/L (ref 135–145)
Total Bilirubin: 0.6 mg/dL (ref 0.3–1.2)
Total Protein: 8.5 g/dL — ABNORMAL HIGH (ref 6.5–8.1)

## 2018-12-27 NOTE — Progress Notes (Signed)
[place] [phone]   [phone]  CONSULT NOTE  REFERRING PHYSICIAN: Dr. Florencia Reasons  REASON FOR CONSULTATION:  73 years old African-American female with suspicious lung cancer.  HPI Mariah Mann is a 73 y.o. female with past medical history significant for osteoarthritis, chronic kidney disease, hypertension, diabetes mellitus, seizure, stroke, GERD as well as history of lung cancer more than 30 years ago.  The daughter mentioned that she received chemo and radiation at that time.  No records available for her previous diagnosis of lung cancer which was treated at [facility].  The patient is a resident of skilled nursing facility and she presented to the emergency department 2 weeks ago complaining of cough as well as speech disturbance.  She had MRI of the brain performed on December 06, 2018 that was unremarkable except for chronic microvascular disease.  There was no metastatic disease to the brain.  She also had CT scan of the chest without contrast on December 09, 2018 and that showed 1.5 cm right upper lobe pulmonary nodule with enlarged right hilar lymphadenopathy.  The patient was referred to me today for evaluation and recommendation regarding this abnormality on her CT scan of the chest. When seen today the patient continues to have dementia she did not speak much during the visit.  Her answer with mainly yes or no.  She denied having any chest pain, shortness of breath, cough or hemoptysis.  She denied having any weight loss or night sweats.  She has no nausea, vomiting, diarrhea or constipation.  She has no headache or visual changes. Family history significant for mother died from natural causes, father had cancer and brother had throat cancer. The patient is a widow and has 1 daughter.  She was accompanied by her daughter Mariah Mann.  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  She has no  history of alcohol or drug abuse.  HPI  Past Medical History:  Diagnosis Date  . Arthritis   . Cancer ([place])   . Chronic kidney disease   . Dementia ([place])   . Depression   . Diabetes mellitus without complication ([place])   . Epilepsy ([place])   . GERD (gastroesophageal reflux disease)   . Gout   . Hyperlipidemia   . Hypertension   . Renal disorder   . Seizures ([place])   . Stroke ([place])   . Urinary incontinence     No past surgical history on file.  No family history on file.  Social History Social History   Tobacco Use  . Smoking status: Never Smoker  . Smokeless tobacco: Never Used  Substance Use Topics  . Alcohol use: Never    Frequency: Never  . Drug use: Never    Allergies  Allergen Reactions  . Contrast Media [Iodinated Diagnostic Agents] Other (See Comments)  . Iodine Other (See Comments)  . Keflex [Cephalexin] Other (See Comments)  . Macrodantin [Nitrofurantoin] Other (See Comments)  . Penicillins Other (See Comments)    Current Outpatient Medications  Medication Sig Dispense Refill  . acetaminophen (TYLENOL) 650 MG CR tablet Take 650 mg by mouth every 6 (six) hours as needed for pain.    Marland Kitchen allopurinol (ZYLOPRIM) 100 MG tablet Take 100 mg by mouth daily.    . Ascorbic Acid (VITAMIN C PO) Take 1 tablet by mouth daily.    Marland Kitchen atorvastatin (LIPITOR) 10 MG tablet Take 1 tablet (10  mg total) by mouth daily at 6 PM for 30 days. 30 tablet 0  . cholecalciferol (D-VI-SOL) 10 MCG/ML LIQD Take 2,000 Units by mouth daily.    Marland Kitchen donepezil (ARICEPT) 10 MG tablet Take 10 mg by mouth daily.     . Emollient (EUCERIN EX) Apply 1 application topically 2 (two) times daily. Apply to both legs and feet    . guaiFENesin (MUCINEX) 600 MG 12 hr tablet Take 1 tablet (600 mg total) by mouth 2 (two) times daily. 60 tablet 0  . losartan (COZAAR) 25 MG tablet Take 25 mg by mouth daily.    . magnesium gluconate (MAGONATE) 30 MG tablet Take 1 tablet (30 mg total) by mouth every Monday,  Wednesday, and Friday. 10 tablet 0  . metFORMIN (GLUCOPHAGE) 500 MG tablet Take 1 tablet (500 mg total) by mouth 2 (two) times daily with a meal. 60 tablet 0  . metoprolol tartrate (LOPRESSOR) 25 MG tablet Take 0.5 tablets (12.5 mg total) by mouth 2 (two) times daily. 30 tablet 0  . thiamine (VITAMIN B-1) 100 MG tablet Take 1 tablet (100 mg total) by mouth daily. 30 tablet 0  . vitamin B-12 (CYANOCOBALAMIN) 1000 MCG tablet Take 1,000 mcg by mouth daily.     No current facility-administered medications for this visit.     Review of Systems  Constitutional: positive for fatigue Eyes: negative Ears, nose, mouth, throat, and face: negative Respiratory: negative Cardiovascular: negative Gastrointestinal: negative Genitourinary:negative Integument/breast: negative Hematologic/lymphatic: negative Musculoskeletal:positive for muscle weakness Neurological: positive for memory problems and speech problems Behavioral/Psych: negative Endocrine: negative Allergic/Immunologic: negative  Physical Exam  DJM:EQAST, healthy, no distress, well nourished, well developed and uncooperative SKIN: skin color, texture, turgor are normal, no rashes or significant lesions HEAD: Normocephalic, No masses, lesions, tenderness or abnormalities EYES: normal, PERRLA, Conjunctiva are pink and non-injected EARS: External ears normal, Canals clear OROPHARYNX:no exudate, no erythema and lips, buccal mucosa, and tongue normal  NECK: supple, no adenopathy, no JVD LYMPH:  no palpable lymphadenopathy, no hepatosplenomegaly BREAST:not examined LUNGS: clear to auscultation , and palpation HEART: regular rate & rhythm, no murmurs and no gallops ABDOMEN:abdomen soft, non-tender, normal bowel sounds and no masses or organomegaly BACK: No CVA tenderness, Range of motion is normal EXTREMITIES:no joint deformities, effusion, or inflammation, no edema  NEURO: The patient was so quiet and has few words to say.  PERFORMANCE  STATUS: ECOG 2  LABORATORY DATA: Lab Results  Component Value Date   WBC 9.9 12/08/2018   HGB 12.1 12/08/2018   HCT 39.0 12/08/2018   MCV 97.5 12/08/2018   PLT 184 12/08/2018      Chemistry      Component Value Date/Time   NA 137 12/09/2018 0730   K 4.4 12/09/2018 0730   CL 106 12/09/2018 0730   CO2 21 (L) 12/09/2018 0730   BUN 11 12/09/2018 0730   CREATININE 1.08 (H) 12/09/2018 0730      Component Value Date/Time   CALCIUM 9.1 12/09/2018 0730   ALKPHOS 65 12/07/2018 0555   AST 26 12/07/2018 0555   ALT 25 12/07/2018 0555   BILITOT 0.7 12/07/2018 0555       RADIOGRAPHIC STUDIES: Dg Chest 2 View  Result Date: 12/06/2018 CLINICAL DATA:  73 y/o  F; AMS. EXAM: CHEST - 2 VIEW COMPARISON:  08/13/2015 chest radiograph FINDINGS: Stable normal cardiac silhouette. Aortic atherosclerosis with calcification. Clear lungs. No pleural effusion or pneumothorax. No acute osseous abnormality is evident. IMPRESSION: No acute pulmonary process identified. Electronically Signed  By: Kristine Garbe M.D.   On: 12/06/2018 18:52   Ct Chest Wo Contrast  Result Date: 12/09/2018 CLINICAL DATA:  73 year old with persistent cough. EXAM: CT CHEST WITHOUT CONTRAST TECHNIQUE: Multidetector CT imaging of the chest was performed following the standard protocol without IV contrast. COMPARISON:  Chest radiograph 12/07/2018 FINDINGS: Cardiovascular: Atherosclerotic calcifications in the thoracic aorta without aneurysm. Heart size is normal without significant pericardial fluid. Mediastinum/Nodes: Calcifications in the mediastinum compatible with old granulomatous disease. Concern for a low-density enlarged lymph node in the right hilar region on sequence 3, image 66 measuring up to 1.8 cm. Difficult to exclude small left hilar nodes on this noncontrast examination. No significant axillary lymph node enlargement. No gross abnormality to the thyroid tissue. Small right supraclavicular lymph nodes.  Lungs/Pleura: There is a lobulated nodule in the right upper lung on sequence 8, image 34 that measures 1.2 x 1.5 x 1.3 cm. This is concerning for a neoplasm. Postsurgical changes most compatible with a right upper lobectomy. Possible right middle lobectomy as well. Few hazy densities in the right hilar region are nonspecific. Patchy densities in the lingula and left lower lobe are nonspecific but may represent atelectasis. Upper Abdomen: Diffuse low-density in the visualized liver is suggestive for hepatic steatosis. Visualized adrenal tissue is unremarkable. Musculoskeletal: No suspicious bone findings. IMPRESSION: 1. Nodule in the right upper lung measuring up to 1.5 cm. There is concern for an enlarged right hilar lymph node. Findings are concerning for primary lung cancer. In addition, there are small indeterminate right supraclavicular lymph nodes. Patient has had partial right lung resection and this could represent recurrent disease. Consider further characterization with a PET-CT. 2. Patchy densities in the left lung are difficult to characterize due to motion artifact but findings are likely related to atelectasis. Subtle infectious or inflammatory process in left lung is difficult to exclude. 3. Hepatic steatosis. 4.  Aortic Atherosclerosis (ICD10-I70.0). These results will be called to the ordering clinician or representative by the Radiologist Assistant, and communication documented in the PACS or zVision Dashboard. Electronically Signed   By: Markus Daft M.D.   On: 12/09/2018 10:42   Mr Brain Wo Contrast  Result Date: 12/06/2018 CLINICAL DATA:  Altered mental status. EXAM: MRI HEAD WITHOUT CONTRAST TECHNIQUE: Multiplanar, multiecho pulse sequences of the brain and surrounding structures were obtained without intravenous contrast. COMPARISON:  Head CT 08/13/2015 FINDINGS: BRAIN: There is no acute infarct, acute hemorrhage, hydrocephalus or extra-axial collection. The midline structures are normal. No  midline shift or other mass effect. Diffuse confluent hyperintense T2-weighted signal within the periventricular, deep and juxtacortical white matter, most commonly due to chronic ischemic microangiopathy. Bifrontal encephalomalacia of may be due to prior trauma or ischemia. There is diffuse, severe atrophy. 5-10 scattered foci of chronic microhemorrhage. VASCULAR: Major intracranial arterial and venous sinus flow voids are normal. SKULL AND UPPER CERVICAL SPINE: Remote left craniotomy. SINUSES/ORBITS: No fluid levels or advanced mucosal thickening. No mastoid or middle ear effusion. The orbits are normal. IMPRESSION: Chronic ischemic microangiopathy and severe volume loss without acute abnormality. Electronically Signed   By: Ulyses Jarred M.D.   On: 12/06/2018 20:20   Dg Chest Port 1 View  Result Date: 12/07/2018 CLINICAL DATA:  Cough EXAM: PORTABLE CHEST 1 VIEW COMPARISON:  Yesterday FINDINGS: Normal heart size. Low lung volumes with interstitial crowding. There is no edema, consolidation, effusion, or pneumothorax. IMPRESSION: Low volume chest without acute finding. Electronically Signed   By: Monte Fantasia M.D.   On: 12/07/2018 05:28  Dg Swallowing Func-speech Pathology  Result Date: 12/08/2018 Objective Swallowing Evaluation: Type of Study: MBS-Modified Barium Swallow Study  Patient Details Name: Annelies Coyt MRN: [id number] Date of Birth: 1945-12-06 Today's Date: 12/08/2018 Time: SLP Start Time (ACUTE ONLY): 4696 -SLP Stop Time (ACUTE ONLY): 0912 SLP Time Calculation (min) (ACUTE ONLY): 20 min Past Medical History: Past Medical History: Diagnosis Date . Arthritis  . Cancer ([place])  . Chronic kidney disease  . Dementia ([place])  . Depression  . Diabetes mellitus without complication ([place])  . Epilepsy ([place])  . GERD (gastroesophageal reflux disease)  . Gout  . Hyperlipidemia  . Hypertension  . Renal disorder  . Seizures ([place])  . Stroke ([place])  . Urinary incontinence  Past Surgical History: No past surgical history  on file. HPI: Mariah Mann is a 73 yo female with worsening aphasia x2 days and possible suicidal ideation a few days ago at facility.  Per MD note, pt was started on Zoloft a few days ago and now with worsening aphasia/mental status.  Pt found to have increased creatinine and to be encephalopathic.  Per family report to MD, pt has been spitting up secretions and this is a new occurence.  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  CXR 12/06/2018 negative, CXR 12/07/2018 negative, Low lung volumes.  Electroencephalogram completed today was abnormal secondary to increased left hemispheric amplitude and intermittent left frontal sharp transients.  Patient with a history of left craniotomy per neurologist note.  RN came during SLP eval to test pt for flur.  Swallow eval ordered.   Subjective: pt awake in chair Assessment / Plan / Recommendation CHL IP CLINICAL IMPRESSIONS 12/08/2018 Clinical Impression Pt presents with mild oral dysphagia due to prior cva impacting motor planning thus resulting in delayed oral transiting, lingual pumping and premature spillage of liquids.  She did have oral residuals with pudding/cracker and mostly cleared with delayed pt initiated swallow.  Pt did NOT swallow barium tablet with thin =  nor added thin or pudding despite max cues and dry spoon, verbal cueing attempts.  She finally expectorated pudding/thin and tablet after several minutes.  Pharyngeal swallow is strong and timely.  Recommend dys3/thin diet with strict aspiration precautions.  If pt is orally holding boluses = she must expectorate them.  Unfortunately pt has purewick in place and thus no oral suction availability.  Medication with puree recommended and have pt feed herself to improve neurological input.   SLP Visit Diagnosis Dysphagia, oral phase (R13.11) Attention and concentration deficit following -- Frontal lobe and executive function deficit following --  Impact on safety and function Mild aspiration risk   CHL IP TREATMENT RECOMMENDATION 12/08/2018 Treatment Recommendations Therapy as outlined in treatment plan below   Prognosis 12/08/2018 Prognosis for Safe Diet Advancement Good Barriers to Reach Goals -- Barriers/Prognosis Comment -- CH[facility] IP DIET RECOMMENDATION 12/08/2018 SLP Diet Recommendations Dysphagia 3 (Mech soft) solids;Thin liquid Liquid Administration via Straw;Cup Medication Administration Whole meds with puree Compensations Slow rate;Small sips/bites;Other (Comment);Minimize environmental distractions Postural Changes Seated upright at 90 degrees;Remain semi-upright after after feeds/meals (Comment)   CHL IP OTHER RECOMMENDATIONS 12/08/2018 Recommended Consults -- Oral Care Recommendations Other (Comment) Other Recommendations --   CH[facility] IP FOLLOW UP RECOMMENDATIONS 12/08/2018 Follow up Recommendations None   CHL IP FREQUENCY AND DURATION 12/08/2018 Speech Therapy Frequency (ACUTE ONLY) min 1 x/week Treatment Duration 1 week      CHL IP ORAL PHASE 12/08/2018 Oral Phase  Impaired Oral - Pudding Teaspoon -- Oral - Pudding Cup -- Oral - Honey Teaspoon -- Oral - Honey Cup -- Oral - Nectar Teaspoon -- Oral - Nectar Cup -- Oral - Nectar Straw Lingual pumping;Premature spillage;Delayed oral transit Oral - Thin Teaspoon -- Oral - Thin Cup -- Oral - Thin Straw Premature spillage;Lingual pumping;Delayed oral transit Oral - Puree Lingual pumping;Delayed oral transit;Piecemeal swallowing;Lingual/palatal residue Oral - Mech Soft Delayed oral transit;Weak lingual manipulation;Piecemeal swallowing;Lingual/palatal residue Oral - Regular -- Oral - Multi-Consistency -- Oral - Pill Holding of bolus Oral Phase - Comment --  CHL IP PHARYNGEAL PHASE 12/08/2018 Pharyngeal Phase WFL Pharyngeal- Pudding Teaspoon -- Pharyngeal -- Pharyngeal- Pudding Cup -- Pharyngeal -- Pharyngeal- Honey Teaspoon -- Pharyngeal -- Pharyngeal- Honey Cup -- Pharyngeal -- Pharyngeal- Nectar Teaspoon -- Pharyngeal --  Pharyngeal- Nectar Cup -- Pharyngeal -- Pharyngeal- Nectar Straw -- Pharyngeal -- Pharyngeal- Thin Teaspoon -- Pharyngeal -- Pharyngeal- Thin Cup -- Pharyngeal -- Pharyngeal- Thin Straw -- Pharyngeal -- Pharyngeal- Puree -- Pharyngeal -- Pharyngeal- Mechanical Soft -- Pharyngeal -- Pharyngeal- Regular -- Pharyngeal -- Pharyngeal- Multi-consistency -- Pharyngeal -- Pharyngeal- Pill -- Pharyngeal -- Pharyngeal Comment --  CHL IP CERVICAL ESOPHAGEAL PHASE 12/08/2018 Cervical Esophageal Phase WFL Pudding Teaspoon -- Pudding Cup -- Honey Teaspoon -- Honey Cup -- Nectar Teaspoon -- Nectar Cup -- Nectar Straw -- Thin Teaspoon -- Thin Cup -- Thin Straw -- Puree -- Mechanical Soft -- Regular -- Multi-consistency -- Pill -- Cervical Esophageal Comment -- Macario Golds 12/08/2018, 2:53 PM    Luanna Salk, [facility] SLP Acute Rehab Services Pager [phone] Office [phone]            ASSESSMENT: This is a very pleasant 73 years old African-American female with previous history of lung cancer of unknown stage or treatment at this point but her daughter mentions that she received chemo and radiation more than 30 years ago at [facility].  The patient has severe dementia as well as multiple other medical problems.  She was seen in the hospital recently and repeat imaging studies showed right upper lobe 1.3 pulmonary nodule in addition to suspicious left hilar lymph node.  This finding concerning for bronchogenic carcinoma.   PLAN: I had a lengthy discussion with the patient and her daughter today about her current condition and further investigation to confirm diagnosis as well as treatment options.  I would have recommended for the patient to have a PET scan as well as bronchoscopy or needle biopsy for tissue diagnosis but the patient and her daughter declined any further investigation at the patient will not be a candidate for any further treatment in the future anyway. They would like to continue with  the current observation and monitoring. The patient is a resident of skilled nursing facility and she will continue her current care by the facility physician. I do not see a need to see this patient in the future but she may benefit from palliative care and hospice if her condition deteriorates. The patient voices understanding of current disease status and treatment options and is in agreement with the current care plan.  All questions were answered. The patient knows to call the clinic with any problems, questions or concerns. We can certainly see the patient much sooner if necessary.  Thank you so much for allowing me to participate in the care of Mariah Mann. I will continue to follow up the patient with you and assist in her care.  I spent 40 minutes counseling the patient  face to face. The total time spent in the appointment was 60 minutes.  Disclaimer: This note was dictated with voice recognition software. Similar sounding words can inadvertently be transcribed and may not be corrected upon review.   Eilleen Kempf December 27, 2018, 2:00 PM

## 2019-02-03 ENCOUNTER — Encounter: Payer: Self-pay | Admitting: *Deleted

## 2019-02-06 ENCOUNTER — Encounter: Payer: Self-pay | Admitting: Diagnostic Neuroimaging

## 2019-02-06 ENCOUNTER — Ambulatory Visit (INDEPENDENT_AMBULATORY_CARE_PROVIDER_SITE_OTHER): Payer: Medicare Other | Admitting: Diagnostic Neuroimaging

## 2019-02-06 VITALS — BP 102/64 | HR 87

## 2019-02-06 DIAGNOSIS — F0390 Unspecified dementia without behavioral disturbance: Secondary | ICD-10-CM | POA: Diagnosis not present

## 2019-02-06 DIAGNOSIS — F03C0 Unspecified dementia, severe, without behavioral disturbance, psychotic disturbance, mood disturbance, and anxiety: Secondary | ICD-10-CM

## 2019-02-06 NOTE — Progress Notes (Signed)
[facility]  PATIENT: Mariah Mann DOB: [birth year]  REFERRING CLINICIAN: Dillard Cannon HISTORY FROM: patient  REASON FOR VISIT: new consult    HISTORICAL  CHIEF COMPLAINT:  Chief Complaint  Patient presents with  . Dementia    rm 6 New Pt , dgtrLattie Haw, living at [facility] center    HISTORY OF PRESENT ILLNESS:   73 year old evaluation of dementia.  Patient was living with her husband until 10-Feb-2015 when he passed away.  He was her primary caregiver.  Around Feb 09, 2013 she developed signs of memory loss was diagnosed with probable mild dementia.  She continued to have cognitive and physical decline.  In 02/10/2015 patient moved in with her daughter.  She stayed for about 1 year and then transition to [facility] for rehabilitation and then skilled nursing facility.  Symptoms have continued to progress over time.  Patient is essentially wheelchair-bound for several years now.  Patient went to the hospital few months ago for suicidal ideation.  She was diagnosed with acute encephalopathy.  Acute stroke was ruled out.  She was also diagnosed with vascular dementia.  Patient was reported to have a "brain lesion" in Feb 09, 1989 and underwent craniotomy.  Patient's daughter does not have any further details about this.  Patient also has been recently diagnosed with possible lesion on the lung, concerning for bronchogenic carcinoma, has had oncology evaluation and is pursuing palliative/conservative management.  At some point patient also had a "stroke" resulting in right hemiparesis.  This may have happened 20 to 30 years ago.   REVIEW OF SYSTEMS: Full 14 system review of systems performed and negative with exception of: As per HPI.  ALLERGIES: Allergies  Allergen Reactions  . Contrast Media [Iodinated Diagnostic Agents] Other (See Comments)  . Iodine Other (See Comments)  . Keflex [Cephalexin] Other (See Comments)  . Macrodantin [Nitrofurantoin] Other (See Comments)  .  Penicillins Other (See Comments)    HOME MEDICATIONS: Outpatient Medications Prior to Visit  Medication Sig Dispense Refill  . acetaminophen (TYLENOL) 650 MG CR tablet Take 650 mg by mouth every 6 (six) hours as needed for pain.    . calcium carbonate (TUMS - DOSED IN MG ELEMENTAL CALCIUM) 500 MG chewable tablet Chew 1 tablet by mouth daily.    Marland Kitchen docusate sodium (COLACE) 100 MG capsule Take 100 mg by mouth 2 (two) times daily.    Marland Kitchen levETIRAcetam (KEPPRA) 500 MG tablet Take 500 mg by mouth 2 (two) times daily.    . magnesium gluconate (MAGONATE) 30 MG tablet Take 1 tablet (30 mg total) by mouth every February 10, 2023, Wednesday, and Friday. 10 tablet 0  . metFORMIN (GLUCOPHAGE) 500 MG tablet Take 1 tablet (500 mg total) by mouth 2 (two) times daily with a meal. 60 tablet 0  . polyethylene glycol (MIRALAX / GLYCOLAX) packet Take 17 g by mouth daily.    . potassium chloride SA (K-DUR,KLOR-CON) 20 MEQ tablet Take 20 mEq by mouth 2 (two) times daily.    Marland Kitchen allopurinol (ZYLOPRIM) 100 MG tablet Take 100 mg by mouth daily.    . Ascorbic Acid (VITAMIN C PO) Take 1 tablet by mouth daily.    Marland Kitchen atorvastatin (LIPITOR) 10 MG tablet Take 1 tablet (10 mg total) by mouth daily at 6 PM for 30 days. 30 tablet 0  . cholecalciferol (D-VI-SOL) 10 MCG/ML LIQD Take 2,000 Units by mouth daily.    Marland Kitchen donepezil (ARICEPT) 10 MG tablet Take 10 mg by mouth daily.     Marland Kitchen  Emollient (EUCERIN EX) Apply 1 application topically 2 (two) times daily. Apply to both legs and feet    . guaiFENesin (MUCINEX) 600 MG 12 hr tablet Take 1 tablet (600 mg total) by mouth 2 (two) times daily. (Patient not taking: Reported on 02/06/2019) 60 tablet 0  . losartan (COZAAR) 25 MG tablet Take 25 mg by mouth daily.    . metoprolol tartrate (LOPRESSOR) 25 MG tablet Take 0.5 tablets (12.5 mg total) by mouth 2 (two) times daily. (Patient not taking: Reported on 02/06/2019) 30 tablet 0  . thiamine (VITAMIN B-1) 100 MG tablet Take 1 tablet (100 mg total) by mouth daily.  (Patient not taking: Reported on 02/06/2019) 30 tablet 0  . vitamin B-12 (CYANOCOBALAMIN) 1000 MCG tablet Take 1,000 mcg by mouth daily.     No facility-administered medications prior to visit.     PAST MEDICAL HISTORY: Past Medical History:  Diagnosis Date  . Arthritis   . Cancer ([place])   . Chronic kidney disease   . Dementia ([place])   . Depression   . Diabetes mellitus without complication ([place])   . Epilepsy ([place])   . GERD (gastroesophageal reflux disease)   . Gout   . Hyperlipidemia   . Hypertension   . Incontinence    bladder, bowel  . Osteoarthritis   . Renal disorder   . Right hemiplegia ([place])   . Seizures ([place])   . Stroke ([place])   . Urinary incontinence   . Vascular dementia ([place])   . Vitamin B12 deficiency     PAST SURGICAL HISTORY: No past surgical history on file.  FAMILY HISTORY: No family history on file.  SOCIAL HISTORY: Social History   Socioeconomic History  . Marital status: Single    Spouse name: Not on file  . Number of children: 1  . Years of education: 37  . Highest education level: Not on file  Occupational History  . Not on file  Social Needs  . Financial resource strain: Not on file  . Food insecurity:    Worry: Not on file    Inability: Not on file  . Transportation needs:    Medical: Not on file    Non-medical: Not on file  Tobacco Use  . Smoking status: Never Smoker  . Smokeless tobacco: Never Used  Substance and Sexual Activity  . Alcohol use: Never    Frequency: Never  . Drug use: Never  . Sexual activity: Not on file  Lifestyle  . Physical activity:    Days per week: Not on file    Minutes per session: Not on file  . Stress: Not on file  Relationships  . Social connections:    Talks on phone: Not on file    Gets together: Not on file    Attends religious service: Not on file    Active member of club or organization: Not on file    Attends meetings of clubs or organizations: Not on file    Relationship status: Not on  file  . Intimate partner violence:    Fear of current or ex partner: Not on file    Emotionally abused: Not on file    Physically abused: Not on file    Forced sexual activity: Not on file  Other Topics Concern  . Not on file  Social History Narrative   02/06/2019 living at [facility], dgtr [name] is [facility] POA     PHYSICAL EXAM  GENERAL EXAM/CONSTITUTIONAL: Vitals:  Vitals:  02/06/19 1526  BP: 102/64  Pulse: 87     There is no height or weight on file to calculate BMI. Wt Readings from Last 3 Encounters:  No data found for Wt     Patient is in no distress; well developed, nourished and groomed; neck is supple  CARDIOVASCULAR:  Examination of carotid arteries is normal; no carotid bruits  Regular rate and rhythm, no murmurs  Examination of peripheral vascular system by observation and palpation is normal  EYES:  Ophthalmoscopic exam of optic discs and posterior segments is normal; no papilledema or hemorrhages  No exam data present  MUSCULOSKELETAL:  Gait, strength, tone, movements noted in Neurologic exam below  NEUROLOGIC: MENTAL STATUS:  No flowsheet data found.  awake, alert, oriented to person  [place] attention and concentration  ABLE TO SAY A FEW WORDS AND SHORT PHRASES; WITHDRAWN; DECR FLUENCY; FOLLOWS SOME SIMPLE COMMANDS  DECR fund of knowledge  CRANIAL NERVE:   2nd - no papilledema on fundoscopic exam  2nd, 3rd, 4th, 6th - pupils equal and reactive to light, visual fields full to confrontation, extraocular muscles intact, no nystagmus  5th - facial sensation symmetric  7th - facial strength symmetric  8th - hearing intact  9th - palate elevates symmetrically, uvula midline  11th - shoulder shrug symmetric  12th - tongue protrusion midline  MOTOR:   normal bulk and tone  DECR STRENGTH IN RUE (2-3) AND BLE (1-2); LUE 3-4  SENSORY:   normal and symmetric to light touch  COORDINATION:    finger-nose-finger, fine finger movements SLOW; NOT FOLLOWING COMMANDS  REFLEXES:   deep tendon reflexes TRACE and symmetric  GAIT/STATION:   IN WHEELCHAIR     DIAGNOSTIC DATA (LABS, IMAGING, TESTING) - I reviewed patient records, labs, notes, testing and imaging myself where available.  Lab Results  Component Value Date   WBC 7.6 12/27/2018   HGB 13.1 12/27/2018   HCT 40.8 12/27/2018   MCV 94.2 12/27/2018   PLT 179 12/27/2018      Component Value Date/Time   NA 137 12/27/2018 1321   K 4.8 12/27/2018 1321   CL 105 12/27/2018 1321   CO2 24 12/27/2018 1321   GLUCOSE 149 (H) 12/27/2018 1321   BUN 15 12/27/2018 1321   CREATININE 1.32 (H) 12/27/2018 1321   CALCIUM 9.6 12/27/2018 1321   PROT 8.5 (H) 12/27/2018 1321   ALBUMIN 3.5 12/27/2018 1321   AST 24 12/27/2018 1321   ALT 31 12/27/2018 1321   ALKPHOS 97 12/27/2018 1321   BILITOT 0.6 12/27/2018 1321   GFRNONAA 40 (L) 12/27/2018 1321   GFRAA 47 (L) 12/27/2018 1321   Lab Results  Component Value Date   CHOL 140 12/09/2018   HDL 34 (L) 12/09/2018   LDLCALC 94 12/09/2018   TRIG 58 12/09/2018   CHOLHDL 4.1 12/09/2018   Lab Results  Component Value Date   HGBA1C 7.2 (H) 12/09/2018   No results found for: VITAMINB12 Lab Results  Component Value Date   TSH 1.421 12/06/2018    12/06/18 MRI brain [I reviewed images myself and agree with interpretation. -VRP]  - Chronic ischemic microangiopathy and severe volume loss without acute abnormality.   ASSESSMENT AND PLAN  73 y.o. year old female here with progressive physical and cognitive decline since 2014, with severe brain atrophy on MRI, most consistent with neurodegenerative dementia.  Dx:  1. Severe dementia ([place])     PLAN:  SEVERE DEMENTIA - continue supportive care - follow up  with PCP  Return for return to PCP.    Penni Bombard, MD 05/06/2093, 7:09 PM Certified in Neurology, Neurophysiology and Neuroimaging  [facility] [address], [place], [place] [address] [phone]

## 2019-12-10 IMAGING — DX DG CHEST 1V PORT
1 series · 1 of 1 positions shown · non-contrast
Comparison: Yesterday

CLINICAL DATA: [facility]

EXAM:
PORTABLE CHEST 1 VIEW

[chest ap]
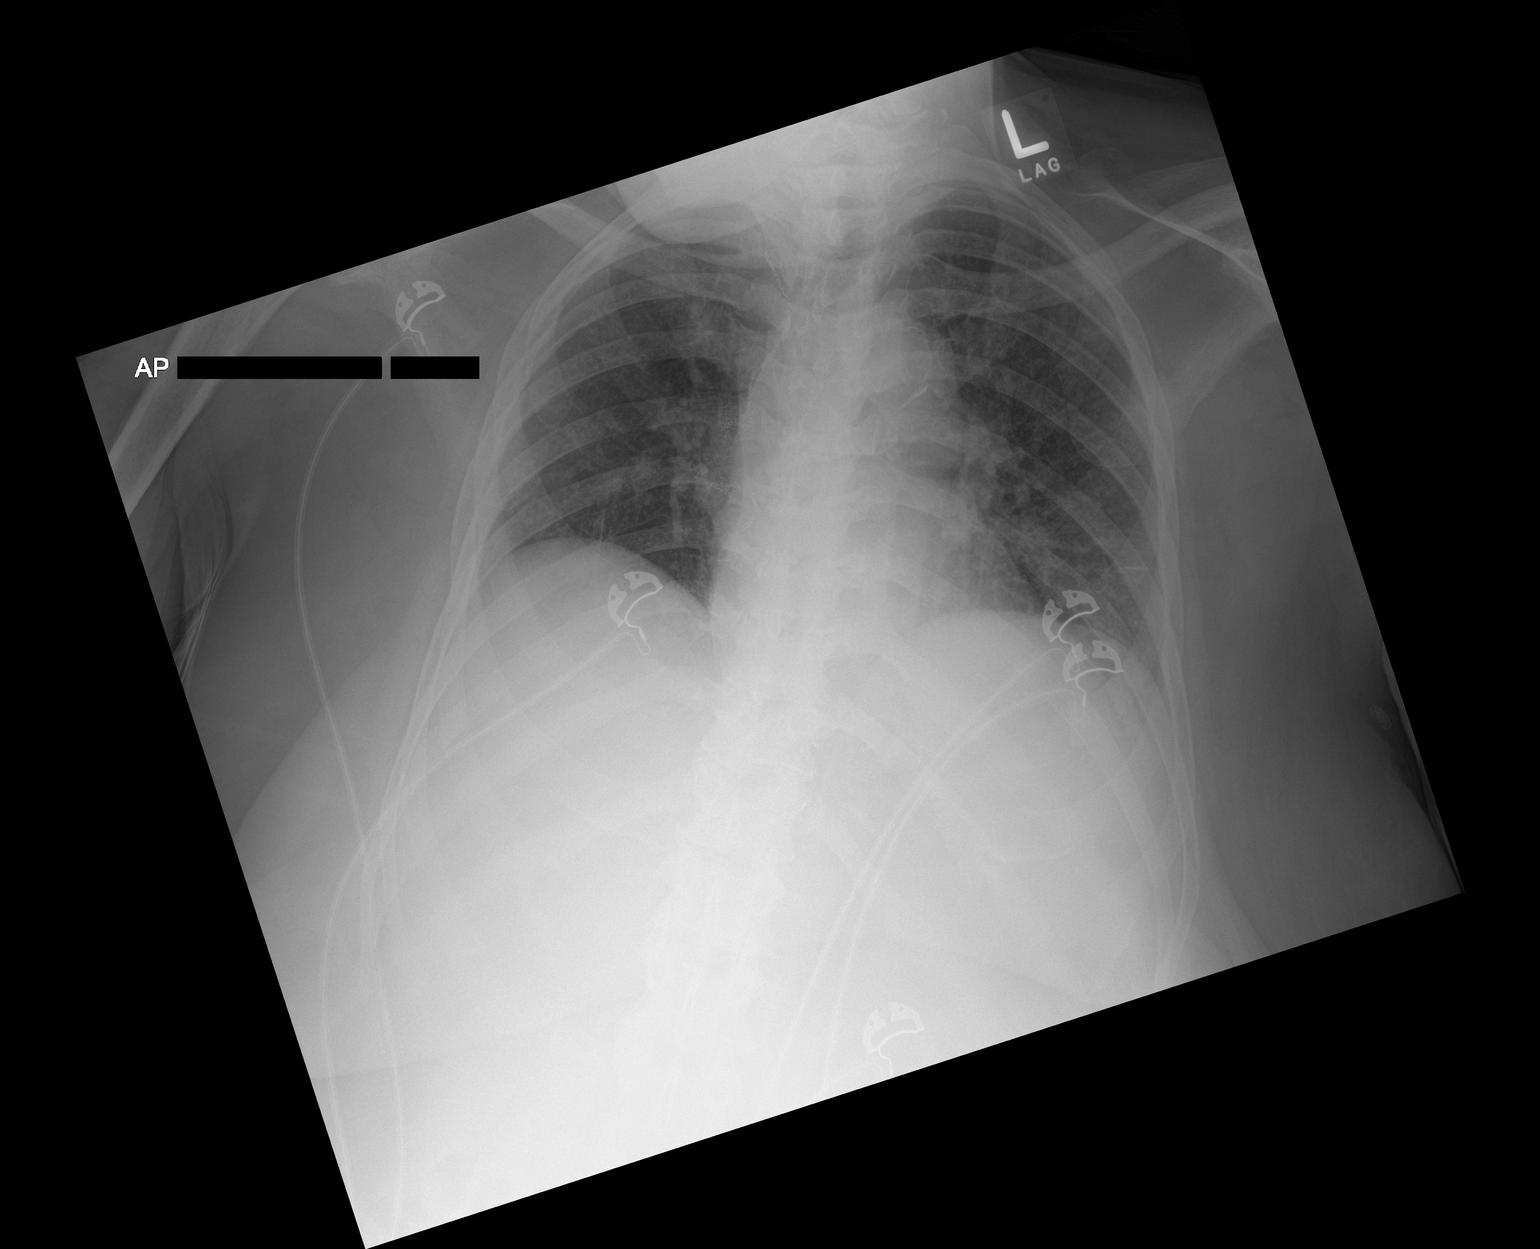

[1 of 1 positions shown; findings below may reference images not displayed]

FINDINGS: Normal heart size. Low lung volumes with interstitial crowding.
There is no edema, consolidation, effusion, or pneumothorax.
IMPRESSION: Low volume chest without acute finding.

## 2020-05-01 ENCOUNTER — Other Ambulatory Visit: Payer: Self-pay | Admitting: Internal Medicine

## 2020-05-01 DIAGNOSIS — R1900 Intra-abdominal and pelvic swelling, mass and lump, unspecified site: Secondary | ICD-10-CM

## 2020-05-17 ENCOUNTER — Ambulatory Visit
Admission: RE | Admit: 2020-05-17 | Discharge: 2020-05-17 | Disposition: A | Discharge status: Home / Self Care | Payer: Medicare Other | Source: Ambulatory Visit | Attending: Internal Medicine | Admitting: Internal Medicine

## 2020-05-17 ENCOUNTER — Other Ambulatory Visit: Payer: Self-pay

## 2020-05-17 DIAGNOSIS — R1900 Intra-abdominal and pelvic swelling, mass and lump, unspecified site: Secondary | ICD-10-CM

## 2020-06-08 ENCOUNTER — Emergency Department (HOSPITAL_COMMUNITY): Payer: Medicare Other

## 2020-06-08 ENCOUNTER — Other Ambulatory Visit: Payer: Self-pay

## 2020-06-08 ENCOUNTER — Encounter (HOSPITAL_COMMUNITY): Payer: Self-pay | Admitting: Emergency Medicine

## 2020-06-08 ENCOUNTER — Inpatient Hospital Stay (HOSPITAL_COMMUNITY)
Admission: EM | Admit: 2020-06-08 | Discharge: 2020-06-30 | DRG: 871 | Disposition: E | Payer: Medicare Other | Attending: Internal Medicine | Admitting: Internal Medicine

## 2020-06-08 DIAGNOSIS — A419 Sepsis, unspecified organism: Secondary | ICD-10-CM | POA: Diagnosis present

## 2020-06-08 DIAGNOSIS — Z881 Allergy status to other antibiotic agents status: Secondary | ICD-10-CM

## 2020-06-08 DIAGNOSIS — M1990 Unspecified osteoarthritis, unspecified site: Secondary | ICD-10-CM | POA: Diagnosis present

## 2020-06-08 DIAGNOSIS — C7951 Secondary malignant neoplasm of bone: Secondary | ICD-10-CM | POA: Diagnosis present

## 2020-06-08 DIAGNOSIS — E538 Deficiency of other specified B group vitamins: Secondary | ICD-10-CM | POA: Diagnosis present

## 2020-06-08 DIAGNOSIS — E1122 Type 2 diabetes mellitus with diabetic chronic kidney disease: Secondary | ICD-10-CM | POA: Diagnosis present

## 2020-06-08 DIAGNOSIS — A414 Sepsis due to anaerobes: Principal | ICD-10-CM | POA: Diagnosis present

## 2020-06-08 DIAGNOSIS — R6521 Severe sepsis with septic shock: Secondary | ICD-10-CM | POA: Diagnosis present

## 2020-06-08 DIAGNOSIS — F0150 Vascular dementia without behavioral disturbance: Secondary | ICD-10-CM | POA: Diagnosis present

## 2020-06-08 DIAGNOSIS — F329 Major depressive disorder, single episode, unspecified: Secondary | ICD-10-CM | POA: Diagnosis present

## 2020-06-08 DIAGNOSIS — Z515 Encounter for palliative care: Secondary | ICD-10-CM | POA: Diagnosis present

## 2020-06-08 DIAGNOSIS — Z7401 Bed confinement status: Secondary | ICD-10-CM

## 2020-06-08 DIAGNOSIS — C787 Secondary malignant neoplasm of liver and intrahepatic bile duct: Secondary | ICD-10-CM | POA: Diagnosis present

## 2020-06-08 DIAGNOSIS — C799 Secondary malignant neoplasm of unspecified site: Secondary | ICD-10-CM

## 2020-06-08 DIAGNOSIS — Z20822 Contact with and (suspected) exposure to covid-19: Secondary | ICD-10-CM | POA: Diagnosis present

## 2020-06-08 DIAGNOSIS — C7970 Secondary malignant neoplasm of unspecified adrenal gland: Secondary | ICD-10-CM | POA: Diagnosis present

## 2020-06-08 DIAGNOSIS — Z66 Do not resuscitate: Secondary | ICD-10-CM | POA: Diagnosis present

## 2020-06-08 DIAGNOSIS — L8990 Pressure ulcer of unspecified site, unspecified stage: Secondary | ICD-10-CM | POA: Diagnosis present

## 2020-06-08 DIAGNOSIS — I129 Hypertensive chronic kidney disease with stage 1 through stage 4 chronic kidney disease, or unspecified chronic kidney disease: Secondary | ICD-10-CM | POA: Diagnosis present

## 2020-06-08 DIAGNOSIS — I69351 Hemiplegia and hemiparesis following cerebral infarction affecting right dominant side: Secondary | ICD-10-CM

## 2020-06-08 DIAGNOSIS — M109 Gout, unspecified: Secondary | ICD-10-CM | POA: Diagnosis present

## 2020-06-08 DIAGNOSIS — G9341 Metabolic encephalopathy: Secondary | ICD-10-CM | POA: Diagnosis present

## 2020-06-08 DIAGNOSIS — N1831 Chronic kidney disease, stage 3a: Secondary | ICD-10-CM | POA: Diagnosis present

## 2020-06-08 DIAGNOSIS — Z91041 Radiographic dye allergy status: Secondary | ICD-10-CM

## 2020-06-08 DIAGNOSIS — C3411 Malignant neoplasm of upper lobe, right bronchus or lung: Secondary | ICD-10-CM | POA: Diagnosis present

## 2020-06-08 DIAGNOSIS — K219 Gastro-esophageal reflux disease without esophagitis: Secondary | ICD-10-CM | POA: Diagnosis present

## 2020-06-08 DIAGNOSIS — L89152 Pressure ulcer of sacral region, stage 2: Secondary | ICD-10-CM | POA: Diagnosis present

## 2020-06-08 DIAGNOSIS — N179 Acute kidney failure, unspecified: Secondary | ICD-10-CM | POA: Diagnosis present

## 2020-06-08 DIAGNOSIS — J9811 Atelectasis: Secondary | ICD-10-CM | POA: Diagnosis present

## 2020-06-08 DIAGNOSIS — I4891 Unspecified atrial fibrillation: Secondary | ICD-10-CM | POA: Diagnosis present

## 2020-06-08 DIAGNOSIS — E785 Hyperlipidemia, unspecified: Secondary | ICD-10-CM | POA: Diagnosis present

## 2020-06-08 DIAGNOSIS — G40909 Epilepsy, unspecified, not intractable, without status epilepticus: Secondary | ICD-10-CM | POA: Diagnosis present

## 2020-06-08 DIAGNOSIS — R0600 Dyspnea, unspecified: Secondary | ICD-10-CM | POA: Diagnosis present

## 2020-06-08 DIAGNOSIS — R4182 Altered mental status, unspecified: Secondary | ICD-10-CM | POA: Diagnosis not present

## 2020-06-08 DIAGNOSIS — I959 Hypotension, unspecified: Secondary | ICD-10-CM | POA: Diagnosis present

## 2020-06-08 DIAGNOSIS — R634 Abnormal weight loss: Secondary | ICD-10-CM | POA: Diagnosis present

## 2020-06-08 DIAGNOSIS — Z880 Allergy status to penicillin: Secondary | ICD-10-CM

## 2020-06-08 DIAGNOSIS — Z79899 Other long term (current) drug therapy: Secondary | ICD-10-CM

## 2020-06-08 DIAGNOSIS — I700 Atherosclerosis of aorta: Secondary | ICD-10-CM | POA: Diagnosis present

## 2020-06-08 LAB — I-STAT CHEM 8, ED
BUN: 93 mg/dL — ABNORMAL HIGH (ref 8–23)
Calcium, Ion: 1.04 mmol/L — ABNORMAL LOW (ref 1.15–1.40)
Chloride: 118 mmol/L — ABNORMAL HIGH (ref 98–111)
Creatinine, Ser: 9.4 mg/dL — ABNORMAL HIGH (ref 0.44–1.00)
Glucose, Bld: 121 mg/dL — ABNORMAL HIGH (ref 70–99)
HCT: 36 % (ref 36.0–46.0)
Hemoglobin: 12.2 g/dL (ref 12.0–15.0)
Potassium: 5.4 mmol/L — ABNORMAL HIGH (ref 3.5–5.1)
Sodium: 145 mmol/L (ref 135–145)
TCO2: 13 mmol/L — ABNORMAL LOW (ref 22–32)

## 2020-06-08 LAB — I-STAT ARTERIAL BLOOD GAS, ED
Acid-base deficit: 8 mmol/L — ABNORMAL HIGH (ref 0.0–2.0)
Bicarbonate: 14.2 mmol/L — ABNORMAL LOW (ref 20.0–28.0)
Calcium, Ion: 1.31 mmol/L (ref 1.15–1.40)
HCT: 44 % (ref 36.0–46.0)
Hemoglobin: 15 g/dL (ref 12.0–15.0)
O2 Saturation: 100 %
Patient temperature: 98
Potassium: 6.4 mmol/L (ref 3.5–5.1)
Sodium: 142 mmol/L (ref 135–145)
TCO2: 15 mmol/L — ABNORMAL LOW (ref 22–32)
pCO2 arterial: 21.7 mmHg — ABNORMAL LOW (ref 32.0–48.0)
pH, Arterial: 7.422 (ref 7.350–7.450)
pO2, Arterial: 268 mmHg — ABNORMAL HIGH (ref 83.0–108.0)

## 2020-06-08 LAB — CBC WITH DIFFERENTIAL/PLATELET
Abs Immature Granulocytes: 0.11 10*3/uL — ABNORMAL HIGH (ref 0.00–0.07)
Basophils Absolute: 0 10*3/uL (ref 0.0–0.1)
Basophils Relative: 0 %
Eosinophils Absolute: 0.1 10*3/uL (ref 0.0–0.5)
Eosinophils Relative: 1 %
HCT: 44.2 % (ref 36.0–46.0)
Hemoglobin: 13.6 g/dL (ref 12.0–15.0)
Immature Granulocytes: 1 %
Lymphocytes Relative: 18 %
Lymphs Abs: 2.1 10*3/uL (ref 0.7–4.0)
MCH: 28.5 pg (ref 26.0–34.0)
MCHC: 30.8 g/dL (ref 30.0–36.0)
MCV: 92.7 fL (ref 80.0–100.0)
Monocytes Absolute: 1.2 10*3/uL — ABNORMAL HIGH (ref 0.1–1.0)
Monocytes Relative: 10 %
Neutro Abs: 8.6 10*3/uL — ABNORMAL HIGH (ref 1.7–7.7)
Neutrophils Relative %: 70 %
Platelets: 263 10*3/uL (ref 150–400)
RBC: 4.77 MIL/uL (ref 3.87–5.11)
RDW: 17.2 % — ABNORMAL HIGH (ref 11.5–15.5)
WBC: 12.2 10*3/uL — ABNORMAL HIGH (ref 4.0–10.5)
nRBC: 0.2 % (ref 0.0–0.2)

## 2020-06-08 LAB — COMPREHENSIVE METABOLIC PANEL
ALT: 12 U/L (ref 0–44)
AST: 38 U/L (ref 15–41)
Albumin: 2 g/dL — ABNORMAL LOW (ref 3.5–5.0)
Alkaline Phosphatase: 173 U/L — ABNORMAL HIGH (ref 38–126)
Anion gap: 20 — ABNORMAL HIGH (ref 5–15)
BUN: 111 mg/dL — ABNORMAL HIGH (ref 8–23)
CO2: 13 mmol/L — ABNORMAL LOW (ref 22–32)
Calcium: 10.4 mg/dL — ABNORMAL HIGH (ref 8.9–10.3)
Chloride: 110 mmol/L (ref 98–111)
Creatinine, Ser: 9.54 mg/dL — ABNORMAL HIGH (ref 0.44–1.00)
GFR calc Af Amer: 4 mL/min — ABNORMAL LOW (ref >60)
GFR calc non Af Amer: 4 mL/min — ABNORMAL LOW (ref >60)
Glucose, Bld: 145 mg/dL — ABNORMAL HIGH (ref 70–99)
Potassium: 6.6 mmol/L (ref 3.5–5.1)
Sodium: 143 mmol/L (ref 135–145)
Total Bilirubin: 1.2 mg/dL (ref 0.3–1.2)
Total Protein: 7.6 g/dL (ref 6.5–8.1)

## 2020-06-08 LAB — LACTIC ACID, PLASMA
Lactic Acid, Venous: 3.1 mmol/L (ref 0.5–1.9)
Lactic Acid, Venous: 4.2 mmol/L (ref 0.5–1.9)

## 2020-06-08 LAB — PROTIME-INR
INR: 1.4 — ABNORMAL HIGH (ref 0.8–1.2)
Prothrombin Time: 16.5 seconds — ABNORMAL HIGH (ref 11.4–15.2)

## 2020-06-08 LAB — CBG MONITORING, ED
Glucose-Capillary: 129 mg/dL — ABNORMAL HIGH (ref 70–99)
Glucose-Capillary: 111 mg/dL — ABNORMAL HIGH (ref 70–99)

## 2020-06-08 LAB — APTT: aPTT: 37 seconds — ABNORMAL HIGH (ref 24–36)

## 2020-06-08 LAB — SARS CORONAVIRUS 2 BY RT PCR (HOSPITAL ORDER, PERFORMED IN ~~LOC~~ HOSPITAL LAB): SARS Coronavirus 2: NEGATIVE

## 2020-06-08 LAB — ETHANOL: Alcohol, Ethyl (B): <10 mg/dL (ref <10)

## 2020-06-08 MED ORDER — DIPHENHYDRAMINE HCL 50 MG/ML IJ SOLN: 12.5000 mg | INTRAMUSCULAR | Status: DC | PRN

## 2020-06-08 MED ORDER — POLYVINYL ALCOHOL 1.4 % OP SOLN
1.0000 [drp] | Freq: Four times a day (QID) | OPHTHALMIC | Status: DC | PRN
  Filled 2020-06-08: qty 15

## 2020-06-08 MED ORDER — DEXTROSE 5 % IV SOLN
0.5000 g | Freq: Three times a day (TID) | INTRAVENOUS | Status: DC
  Filled 2020-06-08: qty 0.5

## 2020-06-08 MED ORDER — VANCOMYCIN HCL IN DEXTROSE 1-5 GM/200ML-% IV SOLN: 1000.0000 mg | Freq: Once | INTRAVENOUS | Status: DC

## 2020-06-08 MED ORDER — MORPHINE 100MG IN NS 100ML (1MG/ML) PREMIX INFUSION
1.0000 mg/h | INTRAVENOUS | Status: DC
  Administered 2020-06-08: 1 mg/h via INTRAVENOUS
  Filled 2020-06-08: qty 100

## 2020-06-08 MED ORDER — BIOTENE DRY MOUTH MT LIQD: 15.0000 mL | OROMUCOSAL | Status: DC | PRN

## 2020-06-08 MED ORDER — METRONIDAZOLE IN NACL 5-0.79 MG/ML-% IV SOLN
500.0000 mg | Freq: Once | INTRAVENOUS | Status: AC
  Administered 2020-06-08: 500 mg via INTRAVENOUS
  Filled 2020-06-08: qty 100

## 2020-06-08 MED ORDER — ACETAMINOPHEN 325 MG PO TABS: 650.0000 mg | ORAL_TABLET | Freq: Four times a day (QID) | ORAL | Status: DC | PRN

## 2020-06-08 MED ORDER — VANCOMYCIN HCL 750 MG/150ML IV SOLN
750.0000 mg | Freq: Two times a day (BID) | INTRAVENOUS | Status: DC
  Filled 2020-06-08: qty 150

## 2020-06-08 MED ORDER — VANCOMYCIN HCL 1500 MG/300ML IV SOLN
1500.0000 mg | Freq: Once | INTRAVENOUS | Status: DC
  Administered 2020-06-08: 1500 mg via INTRAVENOUS
  Filled 2020-06-08: qty 300

## 2020-06-08 MED ORDER — HALOPERIDOL LACTATE 5 MG/ML IJ SOLN
0.5000 mg | INTRAMUSCULAR | Status: DC | PRN
  Administered 2020-06-09: 0.5 mg via INTRAVENOUS
  Filled 2020-06-08: qty 1

## 2020-06-08 MED ORDER — SODIUM CHLORIDE 0.9 % IV BOLUS (SEPSIS)
250.0000 mL | Freq: Once | INTRAVENOUS | Status: AC
  Administered 2020-06-08: 250 mL via INTRAVENOUS

## 2020-06-08 MED ORDER — VANCOMYCIN VARIABLE DOSE PER UNSTABLE RENAL FUNCTION (PHARMACIST DOSING): Status: DC

## 2020-06-08 MED ORDER — DEXTROSE 50 % IV SOLN
1.0000 | Freq: Once | INTRAVENOUS | Status: AC
  Administered 2020-06-08: 50 mL via INTRAVENOUS
  Filled 2020-06-08: qty 50

## 2020-06-08 MED ORDER — GLYCOPYRROLATE 0.2 MG/ML IJ SOLN
0.2000 mg | INTRAMUSCULAR | Status: DC | PRN
  Administered 2020-06-11: 0.2 mg via INTRAVENOUS
  Filled 2020-06-08: qty 1

## 2020-06-08 MED ORDER — ACETAMINOPHEN 650 MG RE SUPP: 650.0000 mg | Freq: Four times a day (QID) | RECTAL | Status: DC | PRN

## 2020-06-08 MED ORDER — HALOPERIDOL LACTATE 2 MG/ML PO CONC
0.5000 mg | ORAL | Status: DC | PRN
  Filled 2020-06-08: qty 0.3

## 2020-06-08 MED ORDER — MORPHINE SULFATE (PF) 2 MG/ML IV SOLN
1.0000 mg | Freq: Once | INTRAVENOUS | Status: AC
  Administered 2020-06-08: 1 mg via INTRAVENOUS
  Filled 2020-06-08: qty 1

## 2020-06-08 MED ORDER — INSULIN ASPART 100 UNIT/ML IV SOLN
10.0000 [IU] | Freq: Once | INTRAVENOUS | Status: AC
  Administered 2020-06-08: 10 [IU] via INTRAVENOUS

## 2020-06-08 MED ORDER — SODIUM CHLORIDE 0.9 % IV BOLUS (SEPSIS)
1000.0000 mL | Freq: Once | INTRAVENOUS | Status: AC
  Administered 2020-06-08: 1000 mL via INTRAVENOUS

## 2020-06-08 MED ORDER — HALOPERIDOL 0.5 MG PO TABS
0.5000 mg | ORAL_TABLET | ORAL | Status: DC | PRN
  Filled 2020-06-08: qty 1

## 2020-06-08 MED ORDER — SODIUM CHLORIDE 0.9 % IV SOLN: 2.0000 g | Freq: Once | INTRAVENOUS | Status: DC

## 2020-06-08 MED ORDER — ONDANSETRON HCL 4 MG/2ML IJ SOLN: 4.0000 mg | Freq: Four times a day (QID) | INTRAMUSCULAR | Status: DC | PRN

## 2020-06-08 MED ORDER — SODIUM CHLORIDE 0.9 % IV SOLN: 2.0000 g | Freq: Three times a day (TID) | INTRAVENOUS | Status: DC

## 2020-06-08 MED ORDER — SODIUM CHLORIDE 0.9 % IV BOLUS
1000.0000 mL | Freq: Once | INTRAVENOUS | Status: AC
  Administered 2020-06-08: 1000 mL via INTRAVENOUS

## 2020-06-08 MED ORDER — GLYCOPYRROLATE 1 MG PO TABS
1.0000 mg | ORAL_TABLET | ORAL | Status: DC | PRN
  Filled 2020-06-08: qty 1

## 2020-06-08 MED ORDER — MORPHINE BOLUS VIA INFUSION
1.0000 mg | INTRAVENOUS | Status: DC | PRN
  Administered 2020-06-09 (×3): 1 mg via INTRAVENOUS
  Filled 2020-06-08: qty 1

## 2020-06-08 MED ORDER — ONDANSETRON 4 MG PO TBDP: 4.0000 mg | ORAL_TABLET | Freq: Four times a day (QID) | ORAL | Status: DC | PRN

## 2020-06-08 MED ORDER — GLYCOPYRROLATE 0.2 MG/ML IJ SOLN: 0.2000 mg | INTRAMUSCULAR | Status: DC | PRN

## 2020-06-08 NOTE — ED Triage Notes (Signed)
Pt BIB [facility] from [facility]. Brought in for shortness of breath, and altered mental status. Pt presents with decreased respiratory rate and altered mental status.

## 2020-06-08 NOTE — Progress Notes (Signed)
  Pharmacy Antibiotic Note  Mariah Mann is a 74 y.o. female admitted on 06/20/2020 with SOB, decreased respiratory rate and altered mental status. Pharmacy has been consulted for vancomycin and aztreonam dosing.  nCrCl ~67mL/min. Pulse 118, RR 7.  WBC / lactate / SCr unavailable  Plan: Vancomycin 1,500mg  IV x1 then vancomycin 750mg  IV every 12 hours.  Goal trough 15-20 mcg/mL. Aztreonam 2g every 8 hours.  Continue to monitor labs, vancomycin trough, cultures and clinical progress.  No data recorded.  No results for input(s): WBC, CREATININE, LATICACIDVEN, VANCOTROUGH, VANCOPEAK, VANCORANDOM, GENTTROUGH, GENTPEAK, GENTRANDOM, TOBRATROUGH, TOBRAPEAK, TOBRARND, AMIKACINPEAK, AMIKACINTROU, AMIKACIN in the last 168 hours.  CrCl cannot be calculated (Patient's most recent lab result is older than the maximum 21 days allowed.).    Allergies  Allergen Reactions  . Contrast Media [Iodinated Diagnostic Agents] Other (See Comments)  . Iodine Other (See Comments)  . Keflex [Cephalexin] Other (See Comments)  . Macrodantin [Nitrofurantoin] Other (See Comments)  . Penicillins Other (See Comments)    Antimicrobials this admission: 7/10 vancomycin >>>  7/10 aztreonam >>> 7/10 metronidazole >>>  Dose adjustments this admission:  Microbiology results: 7/10 BCx: pending  Thank you for allowing pharmacy to be a part of this patient's care.  Mercy Riding, PharmD PGY1 Acute Care Pharmacy Resident Please refer to [facility] for unit-specific pharmacist

## 2020-06-08 NOTE — ED Provider Notes (Signed)
[facility] EMERGENCY DEPARTMENT Provider Note   CSN: [id number] Arrival date & time:        History No chief complaint on file.   Mariah Mann is a 74 y.o. female.  HPI  Level 5 caveat secondary to dementia and altered mental status   74 yo female ho dementia prior stroke, presents from snf with report of abnormal respirations.  EMS reports that her sats were 84% on their arrival.  He has been gazing to the left.  She is generally weak and had no other focal deficits noted.  She is not following directions.  They were told that the patient had an abscess on her abdomen.  I reviewed records from the nursing home that show a CT of this area and it was thought that this was a metastatic lesion patient has history of lung cancer and seizures and is noted to be on Keppra Patient is reported to be full code Review of hospital records reveal last hospitalization in January 2020 at which time she had acute encephalopathy and was discharged to follow-up with her oncologist, neurology and was recommended for palliative care.  Past Medical History:  Diagnosis Date  . Arthritis   . Cancer ([place])   . Chronic kidney disease   . Dementia ([place])   . Depression   . Diabetes mellitus without complication ([place])   . Epilepsy ([place])   . GERD (gastroesophageal reflux disease)   . Gout   . Hyperlipidemia   . Hypertension   . Incontinence    bladder, bowel  . Osteoarthritis   . Renal disorder   . Right hemiplegia ([place])   . Seizures ([place])   . Stroke ([place])   . Urinary incontinence   . Vascular dementia ([place])   . Vitamin B12 deficiency     Patient Active Problem List   Diagnosis Date Noted  . Malignant neoplasm of upper lobe of right lung ([place])   . Altered mental status   . Encephalopathy acute 12/08/2018  . Aphasia   . Vascular dementia without behavioral disturbance ([place])   . Right hemiplegia ([place])   . Hypomagnesemia   . Acute encephalopathy 12/07/2018  . HTN  (hypertension) 12/07/2018  . Debility 12/07/2018  . Depression 12/07/2018    No past surgical history on file.   OB History   No obstetric history on file.     No family history on file.  Social History   Tobacco Use  . Smoking status: Never Smoker  . Smokeless tobacco: Never Used  Substance Use Topics  . Alcohol use: Never  . Drug use: Never    Home Medications Prior to Admission medications   Medication Sig Start Date End Date Taking? Authorizing Provider  acetaminophen (TYLENOL) 650 MG CR tablet Take 650 mg by mouth every 6 (six) hours as needed for pain.    [provider]  allopurinol (ZYLOPRIM) 100 MG tablet Take 100 mg by mouth daily.    [provider]  Ascorbic Acid (VITAMIN C PO) Take 1 tablet by mouth daily.    [provider]  atorvastatin (LIPITOR) 10 MG tablet Take 1 tablet (10 mg total) by mouth daily at 6 PM for 30 days. 12/09/18 01/08/19  Florencia Reasons, MD  calcium carbonate (TUMS - DOSED IN MG ELEMENTAL CALCIUM) 500 MG chewable tablet Chew 1 tablet by mouth daily.    [provider]  cholecalciferol (D-VI-SOL) 10 MCG/ML LIQD Take 2,000 Units by mouth daily.    [provider]  docusate sodium (COLACE) 100 MG capsule Take 100 mg by mouth 2 (two) times daily.    [provider]  donepezil (ARICEPT) 10 MG tablet Take 10 mg by mouth daily.     [provider]  Emollient (EUCERIN EX) Apply 1 application topically 2 (two) times daily. Apply to both legs and feet    [provider]  guaiFENesin (MUCINEX) 600 MG 12 hr tablet Take 1 tablet (600 mg total) by mouth 2 (two) times daily. Patient not taking: Reported on 02/06/2019 12/09/18   Florencia Reasons, MD  levETIRAcetam (KEPPRA) 500 MG tablet Take 500 mg by mouth 2 (two) times daily.    [provider]  losartan (COZAAR) 25 MG tablet Take 25 mg by mouth daily.    [provider]  magnesium gluconate (MAGONATE) 30 MG tablet Take 1 tablet (30 mg  total) by mouth every Monday, Wednesday, and Friday. 12/09/18   Florencia Reasons, MD  metFORMIN (GLUCOPHAGE) 500 MG tablet Take 1 tablet (500 mg total) by mouth 2 (two) times daily with a meal. 12/09/18   Florencia Reasons, MD  metoprolol tartrate (LOPRESSOR) 25 MG tablet Take 0.5 tablets (12.5 mg total) by mouth 2 (two) times daily. Patient not taking: Reported on 02/06/2019 12/09/18   Florencia Reasons, MD  polyethylene glycol [facility] / Floria Raveling) packet Take 17 g by mouth daily.    [provider]  potassium chloride SA (K-DUR,KLOR-CON) 20 MEQ tablet Take 20 mEq by mouth 2 (two) times daily.    [provider]  thiamine (VITAMIN B-1) 100 MG tablet Take 1 tablet (100 mg total) by mouth daily. Patient not taking: Reported on 02/06/2019 12/09/18   Florencia Reasons, MD  vitamin B-12 (CYANOCOBALAMIN) 1000 MCG tablet Take 1,000 mcg by mouth daily.    [provider]    Allergies    Contrast media [iodinated diagnostic agents], Iodine, Keflex [cephalexin], Macrodantin [nitrofurantoin], and Penicillins  Review of Systems   Review of Systems  Unable to perform ROS: Acuity of condition    Physical Exam Updated Vital Signs There were no vitals taken for this visit.  Physical Exam Vitals reviewed. Nursing note reviewed: Patient has been tachycardic to 150s, blood pressure has been low with most recent systolic in the 15Q patient is on a nonrebreather.  Constitutional:      General: She is not in acute distress.    Appearance: She is ill-appearing.  HENT:     Head: Normocephalic.     Mouth/Throat:     Comments: Mucous membranes are dry Eyes:     Comments: Eyes are deviated to the left  Cardiovascular:     Rate and Rhythm: Tachycardia present. Rhythm irregular.  Pulmonary:     Comments: Patient has varying respirations with some pauses Chest:     Chest wall: No mass or deformity.  Abdominal:     Palpations: Abdomen is soft.     Comments: Fungating mass right abdomen that is not appear to be fluctuant   Musculoskeletal:     Cervical back: Normal range of motion.     Comments: Extremities are all flaccid no obvious deformities noted  Skin:    General: Skin is dry.  Neurological:     Comments: Eyes deviated to left hand patient has some occasional moaning and localization to painful stimuli     ED Results / Procedures / Treatments   Labs (all labs ordered are listed, but only abnormal results are displayed) Labs Reviewed - No data  to display  EKG EKG Interpretation  Date/Time:  Saturday June 08 2020 16:31:38 EDT Ventricular Rate:  153 PR Interval:    QRS Duration: 113 QT Interval:  274 QTC Calculation: 435 R Axis:   -95 Text Interpretation: Atrial fibrillation with rapid V-rate Paired ventricular premature complexes LAD, consider left anterior fascicular block Low voltage, extremity and precordial leads Probable anterolateral infarct, old Borderline ST depression, inferior leads Confirmed by Pattricia Boss [phone]) on 06/21/2020 5:11:17 PM   Radiology DG Chest Port 1 View  Result Date: 06/22/2020 CLINICAL DATA:  Central line placement. EXAM: PORTABLE CHEST 1 VIEW COMPARISON:  12/07/2018 FINDINGS: Right IJ central venous catheter tip is in the distal SVC near the cavoatrial junction. The cardiac silhouette, mediastinal and hilar contours are grossly stable. There is tortuosity and calcification of the thoracic aorta. Surgical changes noted involving the right hemithorax. The paraspinal density noted along with probable scarring changes at the right lung base. Moderate loss of volume in the right hemithorax. The bony thorax is intact. IMPRESSION: 1. Right IJ central venous catheter tip in the distal SVC near the cavoatrial junction. 2. Postoperative changes involving the right hemithorax with areas of scarring Electronically Signed   By: Marijo Sanes M.D.   On: 06/21/2020 17:26    Procedures .Central Line  Date/Time: 06/26/2020 5:18 PM Performed by: Pattricia Boss, MD Authorized by:  Pattricia Boss, MD   Consent:    Consent obtained:  Emergent situation Pre-procedure details:    Hand hygiene: Hand hygiene performed prior to insertion     Sterile barrier technique: All elements of maximal sterile technique followed     Skin preparation:  2% chlorhexidine   Skin preparation agent: Skin preparation agent completely dried prior to procedure   Anesthesia (see MAR for exact dosages):    Anesthesia method:  Local infiltration   Local anesthetic:  Lidocaine 1% w/o epi Procedure details:    Location:  R internal jugular   Patient position:  Trendelenburg   Procedural supplies:  Triple lumen   Catheter size:  7.5 Fr   Landmarks identified: yes     Ultrasound guidance: yes     Sterile ultrasound techniques: Sterile gel and sterile probe covers were used     Number of attempts:  1   Successful placement: yes   Post-procedure details:    Post-procedure:  Line sutured   Assessment:  Blood return through all ports, free fluid flow and placement verified by x-Nashly Olsson   Patient tolerance of procedure:  Tolerated well, no immediate complications  .Critical Care Performed by: Pattricia Boss, MD Authorized by: Pattricia Boss, MD   Critical care provider statement:    Critical care time (minutes):  60   Critical care end time:  06/06/2020 6:53 PM   Critical care was necessary to treat or prevent imminent or life-threatening deterioration of the following conditions:  Respiratory failure, sepsis, circulatory failure, CNS failure or compromise and dehydration   Critical care was time spent personally by me on the following activities:  Discussions with consultants, evaluation of patient's response to treatment, examination of patient, ordering and performing treatments and interventions, ordering and review of laboratory studies, ordering and review of radiographic studies, pulse oximetry, re-evaluation of patient's condition, obtaining history from patient or surrogate and review of old  charts Comments:     Extensive discussion with daughter regarding end of life care    (including critical care time)  Medications Ordered in ED Medications - No data to display  ED Course  I have reviewed the triage vital signs and the nursing notes.  Pertinent labs & imaging results that were available during my care of the patient were reviewed by me and considered in my medical decision making (see chart for details).  Clinical Course as of Jun 08 1829  Sat Jun 08, 2020  1755 Patient hypotensive 60/40   [DR]  1757 One liter in second infusing Hyperkalemia noted- glucose insulin ordered    [DR]  1758 New renal failure creatinine elevated at 9.4 with last 1  Creatinine(!): 9.40 [DR]  1759 Lactic acidosis noted  Lactic Acid, Venous(!!): 3.1 [DR]  79 Awaiting daughter at bedside to discuss proceeding with further interventions for rate control and airway   [DR]  1801 Daughter states patient would not wish intuabation and agrees to comfort care at this time   [DR]    Clinical Course User Index [DR] Pattricia Boss, MD   MDM Rules/Calculators/A&P                          74 year old female with history of dementia, 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 fib with RVR, and multiple metabolic abnormalities including new onset of renal failure.  Had discussion with the daughter who is now at the bedside.  Given all of patient's multiple abnormalities, this presentation is consistent with end-of-life.  The daughter does not wish more aggressive care and wishes to keep her mother comfortable.  Plan morphine and comfort care. Discussed with Dr. Lynwood Dawley, on for internal medicine teaching service.  They will see evaluate and assume care. DO NOT RESUSCITATE order has been signed and placed at bedside Final Clinical Impression(s) / ED Diagnoses Final diagnoses:  Metastatic  malignant neoplasm, unspecified site ([place])  Hypotension, unspecified hypotension type  New onset a-fib ([place])  Acute renal failure, unspecified acute renal failure type [facility])    Rx / DC Orders ED Discharge Orders    None       Pattricia Boss, MD 06/28/2020 [phone]

## 2020-06-08 NOTE — ED Notes (Signed)
Assumed care on patient , patient resting , respirations unlabored , Vancomycin IV and NS IV bolus infusing , CVC intact , foley catheter intact , waiting for in-patient bed assignment . Family at bedside , repositioned for comfort/warm blankets provided .

## 2020-06-08 NOTE — H&P (Signed)
Date: 06/05/2020               Patient Name:  Mariah Mann MRN: [id number]  DOB: [birth year] Age / Sex: 74 y.o., female   PCP: Garwin Brothers, MD         Medical Service: Internal Medicine Teaching Service         Attending Physician: Dr. Rebeca Alert Raynaldo Opitz, MD    First Contact: Dr. Alexandria Lodge Pager: [phone]  Second Contact: Dr. Harvie Heck Pager: [phone]       After Hours (After 5p/  First Contact Pager: [phone]  weekends / holidays): Second Contact Pager: [phone]   Chief Complaint: encephalopathy  History of Present Illness:   Mariah Mann is a 74 year old woman with history of dementia, prior stroke, and lung cancer who presented to the [facility] ED from her SNF via EMS due to report of abnormal respirations at her SNF.  Patient unable to contribute to history given her dementia and current condition. History obtained from patient's family and staff at [facility].  Patient's daughter reports she received a call from the patient's SNF earlier today and was told the patient had stopped breathing and was being transferred by EMS to [facility]. The daughter then followed the ambulance and met her mother here. The daughter states the patient was at her usual baseline when she visited her this past Wednesday (7/7), three days prior to this admission. During this visit, the patient ate and drank what her daughter and staff fed her, and was oriented to her self and to her daughter. Per the daughter, the patient is usually oriented to herself, her family, and other people familiar to her, but is not oriented to time. The daughter states during her visit on Thursday, however, the patient was sleeping during her entire visit and not interested in being fed. And again on Friday, the patient was sleeping and not interested in food.   Per nursing staff at the patient's SNF, the patient has had significant unintentional weight loss over the past two months, and over the last month, she has no longer  been self-feeding and instead requires spoon feeding. She has been on a mechanical soft diet and must be monitored since she sometimes coughs while eating and pockets her food. She has also recently been on an anti-emetic given periodic gagging while eating. The nursing staff corroborate the daughter's report that the patient stopped taking food this past Thursday (7/8). The staff was unable to elaborate on the the patient's reported breathing issue. Reports the patient has been bed bound for at least the past several months.  In the ED, the patient was afebrile, hypotensive, tachycardic with irregularly irregular rhythm, tachypneic, satting 100% on non-rebreather. CBC significant for leukocytosis, CMP significant for creatinine of 9.56, K of 6.6 lactic acid of 3.1,  Received 2L NS, started on IV abx, and treated with insulin. ED providers discussed with patient's daughter at bedside who declined intubation and agreed to comfort care measures.  Meds:  Current Meds  Medication Sig  . acetaminophen (TYLENOL) 650 MG CR tablet Take 650 mg by mouth every 6 (six) hours as needed for pain or fever.   . Bismuth Tribromoph-Petrolatum (XEROFORM PETROLATUM DRESSING EX) Apply 1 patch topically See admin instructions. "Clean area to right abdomen with normal saline, pat dry, apply Xeroform, and apply an abdominal pad every night shift for wound care  . donepezil (ARICEPT) 5 MG tablet Take 5 mg by mouth at bedtime.  Marland Kitchen  naproxen sodium (ALEVE) 220 MG tablet Take 220 mg by mouth every 6 (six) hours as needed (for pain).  . NON FORMULARY Take 120 mLs by mouth See admin instructions. MedPass: Drink 120 ml's by mouth three times a day  . ondansetron (ZOFRAN) 4 MG tablet Take 4 mg by mouth 3 (three) times daily.  . pravastatin (PRAVACHOL) 40 MG tablet Take 40 mg by mouth daily.  . Vitamin D, Ergocalciferol, (DRISDOL) 1.25 MG (50000 UNIT) CAPS capsule Take 50,000 Units by mouth every 30 (thirty) days.     Allergies: Allergies as of 06/28/2020 - Review Complete 06/19/2020  Allergen Reaction Noted  . Contrast media [iodinated diagnostic agents] Other (See Comments) 08/12/2015  . Iodine Other (See Comments) 08/12/2015  . Keflex [cephalexin] Other (See Comments) 08/12/2015  . Macrodantin [nitrofurantoin] Other (See Comments) 08/12/2015  . Penicillins Other (See Comments) 08/12/2015   Past Medical History:  Diagnosis Date  . Arthritis   . Cancer ([place])   . Chronic kidney disease   . Dementia ([place])   . Depression   . Diabetes mellitus without complication ([place])   . Epilepsy ([place])   . GERD (gastroesophageal reflux disease)   . Gout   . Hyperlipidemia   . Hypertension   . Incontinence    bladder, bowel  . Osteoarthritis   . Renal disorder   . Right hemiplegia ([place])   . Seizures ([place])   . Stroke ([place])   . Urinary incontinence   . Vascular dementia ([place])   . Vitamin B12 deficiency     Family History: No pertinent family history.  Social History: Patient is widowed. Lived with her husband until 2015/02/05 when he passed away. Is currently living at [facility].  Review of Systems: A complete ROS was negative except as per HPI.  Physical Exam: Blood pressure (!) 72/61, pulse (!) 118, temperature 99 F (37.2 C), temperature source Rectal, resp. rate (!) 22, SpO2 100 %.  Physical Exam Constitutional:      General: She is not in acute distress.    Appearance: She is ill-appearing. She is not diaphoretic.     Comments: Patient is cachectic  HENT:     Head: Normocephalic and atraumatic.  Eyes:     Conjunctiva/sclera: Conjunctivae normal.     Pupils: Pupils are equal, round, and reactive to light.  Cardiovascular:     Rate and Rhythm: Tachycardia present. Rhythm irregularly irregular.     Pulses: Normal pulses.  Pulmonary:     Effort: Tachypnea present.     Breath sounds: Wheezing present.  Abdominal:     Palpations: Abdomen is soft.  Musculoskeletal:     Right  lower leg: No edema.     Left lower leg: No edema.  Skin:    General: Skin is cool and dry.          Comments: Decreased skin turgor.  Neurological:     Comments: Pupils equally round and reactive to light. Patient opens eyes to voice. Moves in bed spontaneously. Follows commands to squeeze with her L hand but not her R hand. Does not respond to questions, non-verbal.       CXR: obtained to confirm central line placement personally reviewed  Assessment & Plan by Problem: Active Problems:   Hypotension  Mariah Mann is a 74 year old woman with history of dementia, prior stroke, and lung cancer who presented to the [facility] ED from her SNF via EMS due to report of abnormal respirations at her SNF. Patient is  acutely encephalopathic and hypotensive with new-onset atrial fibrillation and renal failure. Her presentation is most consistent with end-of-life. The patient's daughter does not wish to pursue aggressive care. Plan is for comfort care, and patient has DNR order.  Comfort care: - morphine drip, PRN Tylenol - oral care - Zofran prn - eye drops PRN - oxygen therapy for comfort - delirium precautions   Dispo: Admit patient to Observation with expected length of stay less than 2 midnights.  Signed: Alexandria Lodge, MD 06/25/2020, 8:44 PM  Pager: [phone] After 5pm on weekdays and 1pm on weekends: On Call pager: [phone]

## 2020-06-08 NOTE — Consult Note (Signed)
Responded to consult, visited and prayed with pt and daughter in [place], which was appreciated. Family is [place]. Pls call again if they desire further chaplain services.   Rev. Eloise Levels Chaplain

## 2020-06-09 DIAGNOSIS — N179 Acute kidney failure, unspecified: Secondary | ICD-10-CM | POA: Diagnosis present

## 2020-06-09 DIAGNOSIS — R0609 Other forms of dyspnea: Secondary | ICD-10-CM

## 2020-06-09 DIAGNOSIS — C3411 Malignant neoplasm of upper lobe, right bronchus or lung: Secondary | ICD-10-CM | POA: Diagnosis present

## 2020-06-09 DIAGNOSIS — I69351 Hemiplegia and hemiparesis following cerebral infarction affecting right dominant side: Secondary | ICD-10-CM | POA: Diagnosis not present

## 2020-06-09 DIAGNOSIS — I959 Hypotension, unspecified: Secondary | ICD-10-CM | POA: Diagnosis present

## 2020-06-09 DIAGNOSIS — Z20822 Contact with and (suspected) exposure to covid-19: Secondary | ICD-10-CM | POA: Diagnosis present

## 2020-06-09 DIAGNOSIS — G40909 Epilepsy, unspecified, not intractable, without status epilepticus: Secondary | ICD-10-CM | POA: Diagnosis present

## 2020-06-09 DIAGNOSIS — A419 Sepsis, unspecified organism: Secondary | ICD-10-CM | POA: Diagnosis present

## 2020-06-09 DIAGNOSIS — Z515 Encounter for palliative care: Secondary | ICD-10-CM

## 2020-06-09 DIAGNOSIS — G9341 Metabolic encephalopathy: Secondary | ICD-10-CM | POA: Diagnosis present

## 2020-06-09 DIAGNOSIS — N1831 Chronic kidney disease, stage 3a: Secondary | ICD-10-CM | POA: Diagnosis present

## 2020-06-09 DIAGNOSIS — C799 Secondary malignant neoplasm of unspecified site: Secondary | ICD-10-CM | POA: Diagnosis present

## 2020-06-09 DIAGNOSIS — K219 Gastro-esophageal reflux disease without esophagitis: Secondary | ICD-10-CM | POA: Diagnosis present

## 2020-06-09 DIAGNOSIS — J9811 Atelectasis: Secondary | ICD-10-CM | POA: Diagnosis present

## 2020-06-09 DIAGNOSIS — I4891 Unspecified atrial fibrillation: Secondary | ICD-10-CM | POA: Diagnosis present

## 2020-06-09 DIAGNOSIS — A414 Sepsis due to anaerobes: Secondary | ICD-10-CM | POA: Diagnosis present

## 2020-06-09 DIAGNOSIS — C787 Secondary malignant neoplasm of liver and intrahepatic bile duct: Secondary | ICD-10-CM | POA: Diagnosis present

## 2020-06-09 DIAGNOSIS — Z66 Do not resuscitate: Secondary | ICD-10-CM | POA: Diagnosis present

## 2020-06-09 DIAGNOSIS — Z7401 Bed confinement status: Secondary | ICD-10-CM | POA: Diagnosis not present

## 2020-06-09 DIAGNOSIS — M1990 Unspecified osteoarthritis, unspecified site: Secondary | ICD-10-CM | POA: Diagnosis present

## 2020-06-09 DIAGNOSIS — L8990 Pressure ulcer of unspecified site, unspecified stage: Secondary | ICD-10-CM | POA: Diagnosis present

## 2020-06-09 DIAGNOSIS — C7951 Secondary malignant neoplasm of bone: Secondary | ICD-10-CM | POA: Diagnosis present

## 2020-06-09 DIAGNOSIS — C7970 Secondary malignant neoplasm of unspecified adrenal gland: Secondary | ICD-10-CM | POA: Diagnosis present

## 2020-06-09 DIAGNOSIS — R6521 Severe sepsis with septic shock: Secondary | ICD-10-CM | POA: Diagnosis present

## 2020-06-09 DIAGNOSIS — L89152 Pressure ulcer of sacral region, stage 2: Secondary | ICD-10-CM | POA: Diagnosis present

## 2020-06-09 DIAGNOSIS — R4182 Altered mental status, unspecified: Secondary | ICD-10-CM | POA: Diagnosis present

## 2020-06-09 DIAGNOSIS — E1122 Type 2 diabetes mellitus with diabetic chronic kidney disease: Secondary | ICD-10-CM | POA: Diagnosis present

## 2020-06-09 DIAGNOSIS — F329 Major depressive disorder, single episode, unspecified: Secondary | ICD-10-CM | POA: Diagnosis present

## 2020-06-09 DIAGNOSIS — R0600 Dyspnea, unspecified: Secondary | ICD-10-CM | POA: Diagnosis present

## 2020-06-09 DIAGNOSIS — I129 Hypertensive chronic kidney disease with stage 1 through stage 4 chronic kidney disease, or unspecified chronic kidney disease: Secondary | ICD-10-CM | POA: Diagnosis present

## 2020-06-09 MED ORDER — LORAZEPAM 2 MG/ML IJ SOLN
1.0000 mg | INTRAMUSCULAR | Status: DC | PRN
  Administered 2020-06-09: 1 mg via INTRAVENOUS
  Filled 2020-06-09: qty 1

## 2020-06-09 MED ORDER — LORAZEPAM BOLUS VIA INFUSION: 1.0000 mg | INTRAVENOUS | Status: DC | PRN

## 2020-06-09 NOTE — Progress Notes (Signed)
   Subjective: Examined patient at bedside with patient's daughter and sister in the room.  Patient is nonverbal, but is able to appropriately nod her head to yes or no questions sometimes. Patient does not appear to be in any pain. We notified the patient's daughter and sister about immediately contacting the nurse should the patient appear to be in any pain.  Spoke with patient's daughter and sister about our management goals. They confirmed understanding.   Objective:  Vital signs in last 24 hours: Vitals:   06/26/2020 1943 06/07/2020 2135 06/09/20 0607 06/09/20 1006  BP: (!) 72/61 (!) 58/34 (!) 60/45 (!) 74/45  Pulse: (!) 118 (!) 111 (!) 122 (!) 126  Resp: (!) 22     Temp:    97.9 F (36.6 C)  TempSrc:    Axillary  SpO2: 100% 93% (!) 87% (!) 85%   Physical Exam Constitutional:      General: She is not in acute distress.    Appearance: She is ill-appearing. She is not diaphoretic.     Comments: Patient is cachectic.  Cardiovascular:     Rate and Rhythm: Tachycardia present. Rhythm irregularly irregular.     Pulses: Normal pulses.  Pulmonary:     Effort: Tachypnea present.     Breath sounds: Wheezing present.  Neurological:     Comments: Pupils equally round and reactive to light. Patient opens eyes to voice. Moves in bed spontaneously. Does not respond verbally respond to questions, but does at times nod her head appropriately.    Assessment/Plan:  Active Problems:   Hypotension  Mariah Mann is a 74 year old woman with history of dementia, prior stroke, and lung cancer who presented to the [facility] ED from her SNF via EMS due to report of abnormal respirations at her SNF. Patient is acutely encephalopathic and hypotensive with new-onset atrial fibrillation and renal failure. Her presentation is most consistent with end-of-life. The patient's daughter does not wish to pursue aggressive care. Plan is for comfort care, and patient has DNR order.  Comfort care: - morphine  drip, PRN Tylenol - ativan 1mg  IV prn for agitation - oral care - Zofran prn - eye drops PRN - oxygen therapy for comfort - delirium precautions  Comfort care - will stay in hospital.  Virl Axe, MD 06/09/2020, 11:56 AM Pager: [phone] After 5pm on weekdays and 1pm on weekends: On Call pager [phone]

## 2020-06-09 NOTE — Consult Note (Signed)
Consultation Note Date: 06/09/2020   Patient Name: Mariah Mann  DOB: Mar 30, 1946  MRN: [id number]  Age / Sex: 74 y.o., female  PCP: Garwin Brothers, MD Referring Physician: Oda Kilts, MD  Reason for Consultation: Non pain symptom management, Pain control, Psychosocial/spiritual support and Terminal Care  HPI/Patient Profile: 74 y.o. female   admitted on 05/31/2020 with history of dementia, prior stroke, and lung cancer who presented to the [facility] ED from her SNF via EMS due to report of abnormal respirations at her SNF.  Per medical records family reports patient has had continued physical and functional decline over the past several days.  Per medical records nursing staff reports patient has had significant unintentional weight loss over the past 2 months, decreased functional status requiring spoon feeding, signs of dysphagia.  ABD CT 05-18-20 IMPRESSION: 1. Right anterior abdominal wall mass most consistent with a primary malignancy or metastatic nodule. Ultrasound may provide better characterization and assist with tissue sampling. 2. Pulmonary, hepatic, adrenal, and osseous metastatic disease. 3. Lytic destruction of the anterior cortex and inferior endplate of L3 versus pathologic fracture. 4. Partially visualized small right pleural effusion with associated partial compressive atelectasis of the visualized right lower lobe. Pneumonia is not excluded clinical correlation is recommended. 5. Aortic Atherosclerosis (ICD10-I70.  Per medical records discussion had in the ED regarding an aggressive medical intervention path versus a palliative comfort path for this patient and family has agreed to comfort care.  She is currently on a morphine drip, with as needed medications to enhance comfort.  Prognosis is likely hours to days.    Clinical Assessment and Goals of Care:  This NP Wadie Lessen reviewed medical records, received report from team, assessed the patient and then meet at the patient's bedside along with her daughter/Lisa and the patient's sister to discuss  GOCs , EOL wishes and for emotional support.    Concept of Palliative Care was introduced as specialized medical care for people and their families living with serious illness.  If focuses on providing relief from the symptoms and stress of a serious illness.  The goal is to improve quality of life for both the patient and the family.      Values and goals of care important to patient and family were attempted to be elicited.   Comfort is a priority and focus of care at this point in time.   Natural trajectory and expectations at EOL were discussed.  Questions and concerns addressed.  Patient  encouraged to call with questions or concerns.     PMT will continue to support holistically.   SUMMARY OF RECOMMENDATIONS    Code Status/Advance Care Planning:  DNR   Symptom Management:   Pain/Dyspnea: Morphine gtt as previously ordered, titration orders added as well as bolus dosing.  Convert Oxygen delivery from face mask to [place]  Palliative Prophylaxis:   Eye Care, Frequent Pain Assessment, Oral Care and Palliative Wound Care  Additional Recommendations (Limitations, Scope, Preferences):  Full Comfort Care  Psycho-social/Spiritual:  Desire for further Chaplaincy support:yes  Additional Recommendations: Grief/Bereavement Support  Prognosis:   Hours - Days  Discharge Planning: Anticipated Hospital Death      Primary Diagnoses: Present on Admission: . Hypotension   I have reviewed the medical record, interviewed the patient and family, and examined the patient. The following aspects are pertinent.  Past Medical History:  Diagnosis Date  . Arthritis   . Cancer ([place])   . Chronic kidney disease   . Dementia ([place])   . Depression   . Diabetes mellitus without complication ([place])   .  Epilepsy ([place])   . GERD (gastroesophageal reflux disease)   . Gout   . Hyperlipidemia   . Hypertension   . Incontinence    bladder, bowel  . Osteoarthritis   . Renal disorder   . Right hemiplegia ([place])   . Seizures ([place])   . Stroke ([place])   . Urinary incontinence   . Vascular dementia ([place])   . Vitamin B12 deficiency    Social History   Socioeconomic History  . Marital status: Single    Spouse name: Not on file  . Number of children: 1  . Years of education: 24  . Highest education level: Not on file  Occupational History  . Not on file  Tobacco Use  . Smoking status: Never Smoker  . Smokeless tobacco: Never Used  Substance and Sexual Activity  . Alcohol use: Never  . Drug use: Never  . Sexual activity: Not on file  Other Topics Concern  . Not on file  Social History Narrative   02/06/2019 living at [facility] dgtr [name] is [facility] POA   Social Determinants of Health   Financial Resource Strain:   . Difficulty of Paying Living Expenses:   Food Insecurity:   . Worried About Charity fundraiser in the Last Year:   . Arboriculturist in the Last Year:   Transportation Needs:   . Film/video editor (Medical):   Marland Kitchen Lack of Transportation (Non-Medical):   Physical Activity:   . Days of Exercise per Week:   . Minutes of Exercise per Session:   Stress:   . Feeling of Stress :   Social Connections:   . Frequency of Communication with Friends and Family:   . Frequency of Social Gatherings with Friends and Family:   . Attends Religious Services:   . Active Member of Clubs or Organizations:   . Attends Archivist Meetings:   Marland Kitchen Marital Status:    History reviewed. No pertinent family history. Scheduled Meds: Continuous Infusions: . morphine 1 mg/hr (06/23/2020 2001)   PRN Meds:.acetaminophen **OR** acetaminophen, antiseptic oral rinse, diphenhydrAMINE, glycopyrrolate **OR** glycopyrrolate **OR** glycopyrrolate, haloperidol **OR** haloperidol  **OR** haloperidol lactate, LORazepam, morphine, ondansetron **OR** ondansetron (ZOFRAN) IV, polyvinyl alcohol Medications Prior to Admission:  Prior to Admission medications   Medication Sig Start Date End Date Taking? Authorizing Provider  acetaminophen (TYLENOL) 650 MG CR tablet Take 650 mg by mouth every 6 (six) hours as needed for pain or fever.    Yes [provider]  Bismuth Tribromoph-Petrolatum (XEROFORM PETROLATUM DRESSING EX) Apply 1 patch topically See admin instructions. "Clean area to right abdomen with normal saline, pat dry, apply Xeroform, and apply an abdominal pad every night shift for wound care   Yes [provider]  donepezil (ARICEPT) 5 MG tablet Take 5 mg by mouth at bedtime.   Yes [provider]  naproxen sodium (ALEVE) 220 MG tablet  Take 220 mg by mouth every 6 (six) hours as needed (for pain).   Yes [provider]  NON FORMULARY Take 120 mLs by mouth See admin instructions. MedPass: Drink 120 ml's by mouth three times a day   Yes [provider]  ondansetron (ZOFRAN) 4 MG tablet Take 4 mg by mouth 3 (three) times daily.   Yes [provider]  pravastatin (PRAVACHOL) 40 MG tablet Take 40 mg by mouth daily.   Yes [provider]  Vitamin D, Ergocalciferol, (DRISDOL) 1.25 MG (50000 UNIT) CAPS capsule Take 50,000 Units by mouth every 30 (thirty) days.   Yes [provider]  allopurinol (ZYLOPRIM) 100 MG tablet Take 100 mg by mouth daily. Patient not taking: Reported on 06/18/2020    [provider]  Ascorbic Acid (VITAMIN C PO) Take 1 tablet by mouth daily. Patient not taking: Reported on 06/21/2020    [provider]  atorvastatin (LIPITOR) 10 MG tablet Take 1 tablet (10 mg total) by mouth daily at 6 PM for 30 days. Patient not taking: Reported on 06/15/2020 12/09/18 06/06/2020  Florencia Reasons, MD  calcium carbonate (TUMS - DOSED IN MG ELEMENTAL CALCIUM) 500 MG chewable tablet Chew 1 tablet by  mouth daily. Patient not taking: Reported on 05/31/2020    [provider]  cholecalciferol (D-VI-SOL) 10 MCG/ML LIQD Take 2,000 Units by mouth daily. Patient not taking: Reported on 06/07/2020    [provider]  docusate sodium (COLACE) 100 MG capsule Take 100 mg by mouth 2 (two) times daily. Patient not taking: Reported on 06/05/2020    [provider]  Emollient (EUCERIN EX) Apply 1 application topically 2 (two) times daily. Apply to both legs and feet Patient not taking: Reported on 06/15/2020    [provider]  guaiFENesin (MUCINEX) 600 MG 12 hr tablet Take 1 tablet (600 mg total) by mouth 2 (two) times daily. Patient not taking: Reported on 06/22/2020 12/09/18   Florencia Reasons, MD  levETIRAcetam (KEPPRA) 500 MG tablet Take 500 mg by mouth 2 (two) times daily. Patient not taking: Reported on 06/26/2020    [provider]  losartan (COZAAR) 25 MG tablet Take 25 mg by mouth daily. Patient not taking: Reported on 06/27/2020    [provider]  magnesium gluconate (MAGONATE) 30 MG tablet Take 1 tablet (30 mg total) by mouth every Monday, Wednesday, and Friday. Patient not taking: Reported on 06/07/2020 12/09/18   Florencia Reasons, MD  metFORMIN (GLUCOPHAGE) 500 MG tablet Take 1 tablet (500 mg total) by mouth 2 (two) times daily with a meal. Patient not taking: Reported on 06/18/2020 12/09/18   Florencia Reasons, MD  metoprolol tartrate (LOPRESSOR) 25 MG tablet Take 0.5 tablets (12.5 mg total) by mouth 2 (two) times daily. Patient not taking: Reported on 06/29/2020 12/09/18   Florencia Reasons, MD  polyethylene glycol [facility] / Floria Raveling) packet Take 17 g by mouth daily. Patient not taking: Reported on 06/04/2020    [provider]  potassium chloride SA (K-DUR,KLOR-CON) 20 MEQ tablet Take 20 mEq by mouth 2 (two) times daily. Patient not taking: Reported on 06/27/2020    [provider]  thiamine (VITAMIN B-1) 100 MG tablet Take 1 tablet (100 mg total) by mouth  daily. Patient not taking: Reported on 06/03/2020 12/09/18   Florencia Reasons, MD  vitamin B-12 (CYANOCOBALAMIN) 1000 MCG tablet Take 1,000 mcg by mouth daily. Patient not taking: Reported on 06/06/2020    [provider]   Allergies  Allergen Reactions  .  Contrast Media [Iodinated Diagnostic Agents] Other (See Comments)    "Allergic," per MAR  . Iodine Other (See Comments)    "Allergic," per MAR  . Keflex [Cephalexin] Other (See Comments)    "Allergic," per MAR  . Macrodantin [Nitrofurantoin] Other (See Comments)    "Allergic," per MAR  . Penicillins Other (See Comments)    "Allergic," per MAR   Review of Systems  Unable to perform ROS: Acuity of condition    Physical Exam Constitutional:      Appearance: She is cachectic. She is ill-appearing.     Interventions: Face mask in place.  Cardiovascular:     Rate and Rhythm: Tachycardia present.  Pulmonary:     Breath sounds: Decreased air movement present.  Skin:    General: Skin is warm and dry.  Neurological:     Comments: Normally responsive to gentle touch and verbal stimuli.     Vital Signs: BP (!) 60/45   Pulse (!) 122   Temp 99 F (37.2 C) (Rectal)   Resp (!) 22   SpO2 (!) 87%  Pain Scale: PAINAD   Pain Score: 0-No pain   SpO2: SpO2: (!) 87 % O2 Device:SpO2: (!) 87 % O2 Flow Rate: .O2 Flow Rate (L/min): 15 L/min  IO: Intake/output summary: No intake or output data in the 24 hours ending 06/09/20 0930  LBM: Last BM Date:  (PTA) Baseline Weight:   Most recent weight:       Palliative Assessment/Data: 20 % at best   Discussed with bedside RN      PMT will continue to support holistically  Time In: 0930 Time Out: 1030 Time Total: 60 minutes Greater than 50%  of this time was spent counseling and coordinating care related to the above assessment and plan.  Signed by: Wadie Lessen, NP   Please contact Palliative Medicine Team phone at [phone] for questions and concerns.  For individual provider: See  Shea Evans

## 2020-06-09 NOTE — Progress Notes (Signed)
Patient removed foley catheter.  Appeared agitated at the time.  Will not attempt re-insertion in keeping with comfort care protocol.  Family present.  Spoke with family re:  Comfort care for patient and rationale for use of both Morphine and Ativan as adjunctive therapies.  Family members appear to agree with present plan of care.

## 2020-06-09 NOTE — Progress Notes (Signed)
PHARMACY - PHYSICIAN COMMUNICATION CRITICAL VALUE ALERT - BLOOD CULTURE IDENTIFICATION (BCID)  Mariah Mann is an 74 y.o. female who presented to [facility] on 06/07/2020 with a chief complaint of encephalopathy  Assessment: 1/2 anaerobic bottle only with Gram positive rods  Name of physician (or Provider) Contacted: Dr. Bridgett Larsson  Current antibiotics: none  Changes to prescribed antibiotics recommended: none. Patient is on comfort care measures only.  No results found for this or any previous visit.  Mariah Mann, PharmD PGY2 Pharmacy Resident Phone between 7 am - 3:30 pm: [phone]  Please check AMION for all [place] phone numbers After 10:00 PM, call [place] [phone]  06/09/2020  10:32 AM

## 2020-06-09 NOTE — Consult Note (Signed)
Pt minimally aware sleepy. Daughter and other family members present at bedside. I explained my reason for my visit. I asked if the pastor had been contacted and they said that they would give him a call. When he got on the line I said I would offer a scripture and would he give the prayer. At the end of the call they mention the name of the church. Then I asked if that church was in [place] and they said yes. 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 I offered caring and supportive presence, I anointed the pt and family members at there request. Further visit where requested by the family.

## 2020-06-10 NOTE — Progress Notes (Signed)
   06/10/20 1023  Clinical Encounter Type  Visited With Patient and family together  Visit Type Spiritual support;Follow-up  Referral From Chaplain  Consult/Referral To [place]  This chaplain responded to On-Call Chaplain's referral for F/U spiritual care.  The Pt. was joined by the Pt. daughter and sister bedside. After rapport building with the family, prayer was accepted and shared. Continued spiritual care was offered by the chaplain to the family as needed.

## 2020-06-10 NOTE — Progress Notes (Signed)
° °  Subjective: Patient was examined at bedside.  Patient is nonverbal. She is sleeping comfortably in bed.  Patient's family at bedside. Informed them of our plan to make the patient as comfortable as possible. Informed them to notify the nurse if they see the patient in any discomfort. Family confirms understanding and agree. All questions and concerns were addressed.  Objective:  Vital signs in last 24 hours: Vitals:   06/20/2020 2135 06/09/20 0607 06/09/20 1006 06/10/20 0642  BP: (!) 58/34 (!) 60/45 (!) 74/45 (!) 89/48  Pulse: (!) 111 (!) 122 (!) 126 91  Resp:    16  Temp:   97.9 F (36.6 C) 97.9 F (36.6 C)  TempSrc:   Axillary Axillary  SpO2: 93% (!) 87% (!) 85% 97%   Physical Exam Constitutional:  General: She is not in acute distress. Appearance: She is ill-appearing. She is notdiaphoretic.  Comments: Patient is cachectic. Cardiovascular:  Rate and Rhythm: Tachycardiapresent. Rhythm irregularly irregular.  Pulses: Normal pulses.  Pulmonary:  Effort: Tachypneapresent.  Breath sounds: Wheezingpresent. Neurological:  Comments: Unable to assess today as patient is sleeping comfortably.  Assessment/Plan:  Principal Problem:   Sepsis ([place]) Active Problems:   Hypotension   Pressure injury of skin   Dyspnea   Metastatic malignant neoplasm [facility])   Palliative care by specialist   DNR (do not resuscitate)  Mariah Mann is a 74 year old woman with history of dementia, prior stroke, and lung cancer who presented to the [facility] ED from her SNF via EMS due to report of abnormal respirations at her SNF.Patient is acutely encephalopathic and hypotensive with new-onset atrial fibrillation and renal failure. Her presentation is most consistent with end-of-life. The patient's daughter does not wish to pursue aggressive care. Plan is for comfort care, and patient has DNR order.  Comfort care: - morphine drip, PRN Tylenol - ativan 1mg  IV prn for  agitation - oral care - Zofran prn - eye drops PRN - oxygen therapy for comfort - delirium precautions  Comfort care - will stay in hospital.  Virl Axe, MD 06/10/2020, 12:38 PM Pager: [phone] After 5pm on weekdays and 1pm on weekends: On Call pager [phone]

## 2020-06-11 LAB — CULTURE, BLOOD (ROUTINE X 2): Special Requests: ADEQUATE

## 2020-06-30 NOTE — Discharge Summary (Signed)
  Name: Mariah Mann MRN: [id number] DOB: 06/18/1946 74 y.o.  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  Disposition and follow-up:   Mariah Mann was discharged from [facility] in expired condition.    Hospital Course: Mariah Mann is a 74 year old woman with history of dementia, epilepsy, prior stroke, hemiplegia, and lung cancer who presented to the [facility] ED on 06/12/2020 from her SNF via EMS due to report of abnormal respirations at her SNF. Mariah Mann was acutely encephalopathic and hypotensive with new-onset atrial fibrillation and renal failure. Her presentation was most consistent with end-of-life. Mariah Mann did not wish to pursue aggressive care. Mariah Mann had a DNR order. Plan throughout course was for comfort care. She was placed on delirium precautions. IV morphine drip was administered for pain with IV morphine boluses as needed. Tylenol prn was also given for pain. IV ativan 1mg  prn was given for agitation. Oxygen therapy was provided for comfort. Oral care, zofran prn, and eye drops prn were also provided. Mariah Mann passed at 1448 on 25-Jun-2020.  Signed: Virl Axe, MD 06-25-20, 3:32 PM

## 2020-06-30 NOTE — Progress Notes (Signed)
   Subjective: Patient examined at bedside.  Appears very comfortable.  Discussed comfort care plans with patient's daughter and sister, who are by bedside. They both agree with plan.  All questions and concerns were addressed.  Objective:  Vital signs in last 24 hours: Vitals:   06/09/20 1006 06/10/20 0642 06/18/2020 0512 06/18/20 1328  BP: (!) 74/45 (!) 89/48 (!) 64/44 (!) 63/52  Pulse: (!) 126 91 87   Resp:  16 19   Temp: 97.9 F (36.6 C) 97.9 F (36.6 C) (!) 97.4 F (36.3 C)   TempSrc: Axillary Axillary Axillary   SpO2: (!) 85% 97% 98%    Physical Exam Constitutional:  General: She is not in acute distress. Appearance: She is ill-appearing. She is notdiaphoretic.  Comments: Patient is cachectic. Cardiovascular:  Rate and Rhythm: Tachycardiapresent. Rhythm irregularly irregular.  Pulses: Normal pulses.  Pulmonary:  Effort: Tachypneapresent.  Breath sounds: Wheezingpresent. Neurological:  Comments: Unable to assess as patient is sleeping comfortably.  Assessment/Plan:  Principal Problem:   Sepsis ([place]) Active Problems:   Hypotension   Pressure injury of skin   Dyspnea   Metastatic malignant neoplasm [facility])   Palliative care by specialist   DNR (do not resuscitate)  Mariah Mann is a 74 year old woman with history of dementia, prior stroke, and lung cancer who presented to the [facility] ED from her SNF via EMS due to report of abnormal respirations at her SNF.Patient is acutely encephalopathic and hypotensive with new-onset atrial fibrillation and renal failure. Her presentation is most consistent with end-of-life. The patient's daughter does not wish to pursue aggressive care. Plan is for comfort care, and patient has DNR order.  Comfort care: - low-dose morphine drip with intermittent boluses as needed, PRN Tylenol - ativan 1mg  IV prn for agitation - oral care - Zofran prn - eye drops PRN - oxygen therapy for comfort -  delirium precautions  Comfort care - will stay in hospital.  Virl Axe, MD 18-Jun-2020, 3:06 PM Pager: [phone] After 5pm on weekdays and 1pm on weekends: On Call pager [phone]

## 2020-06-30 NOTE — Progress Notes (Signed)
39mL from morphine infusion wasted in stericycle with Steffanie Dunn, Therapist, sports.

## 2020-06-30 NOTE — Progress Notes (Signed)
This chaplain was pastorally present with the family and RN at the Pt. time of death.  Spiritual care was offered as the family communicated with other family members and recognized the Pt. is free from suffering.  The chaplain is available for F/U spiritual care as needed.

## 2020-06-30 NOTE — Progress Notes (Signed)
Pt passed at 1448. Confirmed with Steffanie Dunn, Therapist, sports. Dr. Virl Axe aware.

## 2020-06-30 NOTE — Progress Notes (Signed)
IO removed from LLE. Site clean, dry, intact. Gauze dressing placed over site.

## 2020-06-30 NOTE — Plan of Care (Signed)
  Problem: Clinical Measurements: Goal: Ability to maintain clinical measurements within normal limits will improve Outcome: Not Progressing   Problem: Activity: Goal: Risk for activity intolerance will decrease Outcome: Not Progressing   Problem: Pain Managment: Goal: General experience of comfort will improve Outcome: Progressing

## 2020-06-30 NOTE — Plan of Care (Signed)

## 2020-06-30 DEATH — deceased
